# Patient Record
Sex: Female | Born: 1960
Health system: Southern US, Community
[De-identification: ages and names within clinical notes are randomized; demographics above are authoritative.]

## PROBLEM LIST (undated history)

## (undated) DIAGNOSIS — L900 Lichen sclerosus et atrophicus: Secondary | ICD-10-CM

## (undated) DIAGNOSIS — E559 Vitamin D deficiency, unspecified: Secondary | ICD-10-CM

## (undated) DIAGNOSIS — K222 Esophageal obstruction: Secondary | ICD-10-CM

## (undated) DIAGNOSIS — K219 Gastro-esophageal reflux disease without esophagitis: Secondary | ICD-10-CM

## (undated) DIAGNOSIS — E119 Type 2 diabetes mellitus without complications: Secondary | ICD-10-CM

## (undated) DIAGNOSIS — F419 Anxiety disorder, unspecified: Secondary | ICD-10-CM

## (undated) DIAGNOSIS — I10 Essential (primary) hypertension: Secondary | ICD-10-CM

## (undated) DIAGNOSIS — Z86010 Personal history of colonic polyps: Secondary | ICD-10-CM

## (undated) DIAGNOSIS — Z860101 Personal history of adenomatous and serrated colon polyps: Secondary | ICD-10-CM

## (undated) DIAGNOSIS — K5730 Diverticulosis of large intestine without perforation or abscess without bleeding: Secondary | ICD-10-CM

## (undated) DIAGNOSIS — K449 Diaphragmatic hernia without obstruction or gangrene: Secondary | ICD-10-CM

## (undated) DIAGNOSIS — E785 Hyperlipidemia, unspecified: Secondary | ICD-10-CM

## (undated) HISTORY — DX: Esophageal obstruction: K22.2

## (undated) HISTORY — DX: Personal history of adenomatous and serrated colon polyps: Z86.0101

## (undated) HISTORY — PX: COLONOSCOPY: SHX174

## (undated) HISTORY — PX: UPPER GI ENDOSCOPY: SHX6162

## (undated) HISTORY — DX: Type 2 diabetes mellitus without complications: E11.9

## (undated) HISTORY — DX: Anxiety disorder, unspecified: F41.9

## (undated) HISTORY — DX: Essential (primary) hypertension: I10

## (undated) HISTORY — DX: Personal history of colonic polyps: Z86.010

## (undated) HISTORY — PX: UPPER GASTROINTESTINAL ENDOSCOPY: SHX188

## (undated) HISTORY — DX: Hyperlipidemia, unspecified: E78.5

## (undated) HISTORY — DX: Lichen sclerosus et atrophicus: L90.0

## (undated) HISTORY — PX: POLYPECTOMY: SHX149

## (undated) HISTORY — DX: Gastro-esophageal reflux disease without esophagitis: K21.9

## (undated) HISTORY — DX: Vitamin D deficiency, unspecified: E55.9

## (undated) HISTORY — DX: Diaphragmatic hernia without obstruction or gangrene: K44.9

## (undated) HISTORY — DX: Diverticulosis of large intestine without perforation or abscess without bleeding: K57.30

---

## 2000-04-03 ENCOUNTER — Encounter: Payer: Self-pay | Admitting: Obstetrics and Gynecology

## 2000-04-03 ENCOUNTER — Encounter: Admission: RE | Admit: 2000-04-03 | Discharge: 2000-04-03 | Payer: Self-pay | Admitting: Obstetrics and Gynecology

## 2000-04-05 ENCOUNTER — Encounter: Payer: Self-pay | Admitting: Obstetrics and Gynecology

## 2000-04-05 ENCOUNTER — Encounter: Admission: RE | Admit: 2000-04-05 | Discharge: 2000-04-05 | Payer: Self-pay | Admitting: Obstetrics and Gynecology

## 2001-08-24 ENCOUNTER — Encounter: Payer: Self-pay | Admitting: Obstetrics and Gynecology

## 2001-08-24 ENCOUNTER — Encounter: Admission: RE | Admit: 2001-08-24 | Discharge: 2001-08-24 | Payer: Self-pay | Admitting: Obstetrics and Gynecology

## 2003-02-09 ENCOUNTER — Encounter: Payer: Self-pay | Admitting: Emergency Medicine

## 2003-02-09 ENCOUNTER — Emergency Department (HOSPITAL_COMMUNITY): Admission: AD | Admit: 2003-02-09 | Discharge: 2003-02-09 | Payer: Self-pay | Admitting: Emergency Medicine

## 2003-02-19 ENCOUNTER — Encounter: Payer: Self-pay | Admitting: Obstetrics and Gynecology

## 2003-02-19 ENCOUNTER — Encounter: Admission: RE | Admit: 2003-02-19 | Discharge: 2003-02-19 | Payer: Self-pay | Admitting: Obstetrics and Gynecology

## 2004-03-02 ENCOUNTER — Encounter: Admission: RE | Admit: 2004-03-02 | Discharge: 2004-03-02 | Payer: Self-pay | Admitting: Obstetrics and Gynecology

## 2004-07-26 ENCOUNTER — Ambulatory Visit: Payer: Self-pay | Admitting: Gastroenterology

## 2004-08-10 ENCOUNTER — Encounter (INDEPENDENT_AMBULATORY_CARE_PROVIDER_SITE_OTHER): Payer: Self-pay | Admitting: *Deleted

## 2004-08-10 ENCOUNTER — Ambulatory Visit: Payer: Self-pay | Admitting: Gastroenterology

## 2004-08-10 DIAGNOSIS — D126 Benign neoplasm of colon, unspecified: Secondary | ICD-10-CM | POA: Insufficient documentation

## 2004-08-10 DIAGNOSIS — K5730 Diverticulosis of large intestine without perforation or abscess without bleeding: Secondary | ICD-10-CM | POA: Insufficient documentation

## 2005-01-13 ENCOUNTER — Other Ambulatory Visit: Admission: RE | Admit: 2005-01-13 | Discharge: 2005-01-13 | Payer: Self-pay | Admitting: Family Medicine

## 2005-03-04 ENCOUNTER — Encounter: Admission: RE | Admit: 2005-03-04 | Discharge: 2005-03-04 | Payer: Self-pay | Admitting: Obstetrics and Gynecology

## 2006-03-06 ENCOUNTER — Encounter: Admission: RE | Admit: 2006-03-06 | Discharge: 2006-03-06 | Payer: Self-pay | Admitting: Obstetrics and Gynecology

## 2007-03-19 ENCOUNTER — Encounter: Admission: RE | Admit: 2007-03-19 | Discharge: 2007-03-19 | Payer: Self-pay | Admitting: Obstetrics and Gynecology

## 2007-11-25 ENCOUNTER — Emergency Department (HOSPITAL_COMMUNITY): Admission: EM | Admit: 2007-11-25 | Discharge: 2007-11-25 | Payer: Self-pay | Admitting: Emergency Medicine

## 2008-01-08 ENCOUNTER — Ambulatory Visit: Payer: Self-pay | Admitting: Gastroenterology

## 2008-01-08 DIAGNOSIS — K219 Gastro-esophageal reflux disease without esophagitis: Secondary | ICD-10-CM | POA: Insufficient documentation

## 2008-01-08 DIAGNOSIS — R1310 Dysphagia, unspecified: Secondary | ICD-10-CM | POA: Insufficient documentation

## 2008-01-16 ENCOUNTER — Ambulatory Visit: Payer: Self-pay | Admitting: Gastroenterology

## 2008-06-02 ENCOUNTER — Encounter: Admission: RE | Admit: 2008-06-02 | Discharge: 2008-06-02 | Payer: Self-pay | Admitting: Family Medicine

## 2008-06-02 ENCOUNTER — Encounter: Payer: Self-pay | Admitting: Family Medicine

## 2008-06-02 ENCOUNTER — Ambulatory Visit (HOSPITAL_COMMUNITY): Admission: RE | Admit: 2008-06-02 | Discharge: 2008-06-02 | Payer: Self-pay | Admitting: Family Medicine

## 2008-06-02 ENCOUNTER — Ambulatory Visit: Payer: Self-pay | Admitting: Cardiology

## 2008-07-22 ENCOUNTER — Encounter (INDEPENDENT_AMBULATORY_CARE_PROVIDER_SITE_OTHER): Payer: Self-pay | Admitting: *Deleted

## 2009-07-21 ENCOUNTER — Encounter (INDEPENDENT_AMBULATORY_CARE_PROVIDER_SITE_OTHER): Payer: Self-pay | Admitting: *Deleted

## 2009-07-27 ENCOUNTER — Encounter: Admission: RE | Admit: 2009-07-27 | Discharge: 2009-07-27 | Payer: Self-pay | Admitting: Obstetrics and Gynecology

## 2009-09-11 DIAGNOSIS — K222 Esophageal obstruction: Secondary | ICD-10-CM | POA: Insufficient documentation

## 2009-09-11 DIAGNOSIS — K449 Diaphragmatic hernia without obstruction or gangrene: Secondary | ICD-10-CM | POA: Insufficient documentation

## 2009-09-11 DIAGNOSIS — Z860100 Personal history of colon polyps, unspecified: Secondary | ICD-10-CM | POA: Insufficient documentation

## 2009-09-11 DIAGNOSIS — I10 Essential (primary) hypertension: Secondary | ICD-10-CM | POA: Insufficient documentation

## 2009-09-14 ENCOUNTER — Ambulatory Visit: Payer: Self-pay | Admitting: Gastroenterology

## 2009-09-29 ENCOUNTER — Ambulatory Visit: Payer: Self-pay | Admitting: Gastroenterology

## 2009-09-29 LAB — HM COLONOSCOPY

## 2009-10-01 ENCOUNTER — Encounter: Payer: Self-pay | Admitting: Gastroenterology

## 2010-03-11 ENCOUNTER — Emergency Department (HOSPITAL_COMMUNITY): Admission: EM | Admit: 2010-03-11 | Discharge: 2010-03-11 | Payer: Self-pay | Admitting: Emergency Medicine

## 2010-06-28 ENCOUNTER — Other Ambulatory Visit: Payer: Self-pay | Admitting: Obstetrics and Gynecology

## 2010-06-28 DIAGNOSIS — Z139 Encounter for screening, unspecified: Secondary | ICD-10-CM

## 2010-06-29 NOTE — Assessment & Plan Note (Signed)
Summary: discuss colon/abdominal pain--ch.   History of Present Illness Visit Type: new patient Primary GI MD: Melvia Heaps MD [facility] Primary Provider: Kyra Manges, MD Chief Complaint: epigastric pain-hx of Hiatal Hernia, pt is also due for recall colonoscopy History of Present Illness:   [name] is a 50 year old white female with a history of adenomatous colon polyps referred for followup colonoscopy.  At last colonoscopy in 2006 a 4 mm adenomatous polyp was removed.  That same year an esophageal stricture was dilated.  She has had no dysphagia since that time.  She currently has no lower GI complaints.  Approximately a month ago she had a two-hour episode of severe left upper quadrant pain that subsided spontaneously.  She took Zantac for a short period of time.  Pyrosis or dysphagia.   GI Review of Systems    Reports abdominal pain, acid reflux, bloating, dysphagia with solids, and  heartburn.     Location of  Abdominal pain: epigastric area.    Denies belching, chest pain, dysphagia with liquids, loss of appetite, nausea, vomiting, vomiting blood, weight loss, and  weight gain.        Denies anal fissure, black tarry stools, change in bowel habit, constipation, diarrhea, diverticulosis, fecal incontinence, heme positive stool, hemorrhoids, irritable bowel syndrome, jaundice, light color stool, liver problems, rectal bleeding, and  rectal pain.    Current Medications (verified): 1)  Uniretic 15-12.5 Mg Tabs (Moexipril-Hydrochlorothiazide) .... Take 1/2 Tablet By Mouth Once Daily  Allergies (verified): No Known Drug Allergies  Past History:  Past Medical History: Reviewed history from 09/11/2009 and no changes required. Current Problems:  ADENOCARCINOMA, COLON, FAMILY HX (ICD-V16.0) HYPERTENSION (ICD-401.9) COLONIC POLYPS, HX OF (ICD-V12.72) DIVERTICULOSIS, COLON (ICD-562.10) SCHATZKI'S RING (ICD-530.3) HIATAL HERNIA (ICD-553.3)  Past Surgical History: C-Section  Family  History: Family History of Colon Cancer:PGM dx'd @ 51yo Family History of Pancreatic Cancer: PGF Family History of Appendix Cancer: Father Family History of Diabetes: Paternal side of family Family History of Irritable Bowel Syndrome: Mother  Social History: Married, 2 boys, 1 girl Occupation: Product/process development scientist Patient has never smoked.  Alcohol Use - no Illicit Drug Use - no  Review of Systems       The patient complains of night sweats.  The patient denies allergy/sinus, anemia, anxiety-new, arthritis/joint pain, back pain, blood in urine, breast changes/lumps, confusion, cough, coughing up blood, depression-new, fainting, fatigue, fever, headaches-new, hearing problems, heart murmur, heart rhythm changes, itching, menstrual pain, muscle pains/cramps, nosebleeds, pregnancy symptoms, shortness of breath, skin rash, sleeping problems, sore throat, swelling of feet/legs, swollen lymph glands, thirst - excessive, urination - excessive, urination changes/pain, urine leakage, vision changes, and voice change.         All other systems were reviewed and were negative   Vital Signs:  Patient profile:   50 year old female Height:      68 inches Weight:      181 pounds BMI:     27.62 Pulse rate:   64 / minute Pulse rhythm:   regular BP sitting:   130 / 86  (left arm) Cuff size:   regular  Vitals Entered By: Francee Piccolo CMA Duncan Dull) (September 14, 2009 9:09 AM)  Physical Exam  Additional Exam:  On physical exam she is a healthy-appearing female  skin: anicteric HEENT: normocephalic; PEERLA; no nasal or pharyngeal abnormalities neck: supple nodes: no cervical lymphadenopathy chest: clear to ausculatation and percussion heart: no murmurs, gallops, or rubs abd: soft, nontender; BS normoactive; no abdominal masses, tenderness, organomegaly rectal:  deferred ext: no cynanosis, clubbing, edema skeletal: no deformities neuro: oriented x 3; no focal abnormalities    Impression &  Recommendations:  Problem # 1:  COLONIC POLYPS, HX OF (ICD-V12.72)  Patient will be scheduled for followup colonoscopy  Risks, alternatives, and complications of the procedure, including bleeding, perforation, and possible need for surgery, were explained to the patient.  Patient's questions were answered.  Orders: Colon/Endo (Colon/Endo)  Problem # 2:  SCHATZKI'S RING (ICD-530.3)  Repeat dilatation p.r.n.  Orders: Colon/Endo (Colon/Endo)  Problem # 3:  ADENOCARCINOMA, COLON, FAMILY HX (ICD-V16.0)  Grandparent with colon cancer at age 51.  Father with CA the appendix.  Orders: Colon/Endo (Colon/Endo)  Patient Instructions: 1)  Your colonoscopy/EGD is scheduled for 09/29/2009 at 2pm 2)  You can pick up your MoviPrep from your pharmacy today 3)  The medication list was reviewed and reconciled.  All changed / newly prescribed medications were explained.  A complete medication list was provided to the patient / caregiver. Prescriptions: MOVIPREP 100 GM  SOLR (PEG-KCL-NACL-NASULF-NA ASC-C) As per prep instructions.  #1 x 0   Entered by:   Merri Ray CMA (AAMA)   Authorized by:   Louis Meckel MD   Signed by:   Merri Ray CMA (AAMA) on 09/14/2009   Method used:   Faxed to ...       [facility]retail[facility]       [address]       [place], [place]  [address]       Ph: [phone] or [phone]       Fax: [phone]   RxID:   [id number]   Appended Document: discuss colon/abdominal pain--ch. Pt states she has had persistent LUQ pain and wishes to have an EGD along with her colonoscopy.  I agreed to do this.

## 2010-06-29 NOTE — Procedures (Signed)
Summary: [facility] EGD   EGD  Procedure date:  08/10/2004  Findings:      Location: [facility]   Patient Name: Brandy Smith, Brandy Smith MRN:  Procedure Procedures: Panendoscopy (EGD) CPT: 43235.  Personnel: Endoscopist: Ulyess Mort, MD.  Referred By: Montey Hora, MD.  Exam Location: Exam performed in Outpatient Clinic. Outpatient  Patient Consent: Procedure, Alternatives, Risks and Benefits discussed, consent obtained, from patient. Consent was obtained by the RN.  Indications Symptoms: Dysphagia. Dyspepsia, Reflux symptoms  Exam Exam Info: Maximum depth of insertion Duodenum, intended Duodenum. Patient position: on left side. Vocal cords visualized. Gastric retroflexion performed. Images were not taken. ASA Classification: II. Tolerance: good.  Sedation Meds: Patient assessed and found to be appropriate for moderate (conscious) sedation. Fentanyl 50 mcg. given IV. Versed 5 mg. given IV. Cetacaine Spray 2 sprays given aerosolized.  Monitoring: BP and pulse monitoring done. Oximetry used. Supplemental O2 given  Findings - HIATAL HERNIA: 2 cms. in length. schatzki ring. ICD9: Hernia, Hiatal: 553.3. - Dilation: Fundus. Maloney dilator used, Diameter: 52 mm, Minimal Resistance, No Heme present on extraction. Patient tolerance excellent.  - Normal: Fundus to Jejunum.   Assessment Abnormal examination, see findings above.  Diagnoses: 553.3: Hernia, Hiatal.   Events  Unplanned Intervention: No unplanned interventions were required.  Unplanned Events: There were no complications. Plans Medication(s): Continue current medications.  Patient Education: Patient given standard instructions for: Hiatal Hernia.  Disposition: After procedure patient sent to recovery. After recovery patient sent home.   cc: Montey Hora, MD  This report was created from the original endoscopy report, which was reviewed and signed by the above listed endoscopist.

## 2010-06-29 NOTE — Procedures (Signed)
Summary: [facility] EGD   EGD  Procedure date:  01/16/2008  Findings:      Location: [facility]   Patient Name: Brandy Smith, Brandy Smith MRN:  Procedure Procedures: Panendoscopy (EGD) CPT: 43235.    with esophageal dilation. CPT: G9296129.  Personnel: Endoscopist: Barbette Hair. Arlyce Dice, MD.  Indications Symptoms: Dysphagia.  History  Current Medications: Patient is not currently taking Coumadin.  Pre-Exam Physical: Performed Jan 16, 2008  Cardio-pulmonary exam, Abdominal exam WNL.  Exam Exam Info: Maximum depth of insertion Duodenum, intended Duodenum. Patient position: on left side. Vocal cords visualized. Gastric retroflexion performed. ASA Classification: II. Tolerance: fair, adequate exam.  Sedation Meds: Patient assessed and found to be appropriate for moderate (conscious) sedation. Fentanyl 100 mcg. given IV. Versed 10 mg. given IV. Robinul 0.2 given IV. Cetacaine Spray 2 sprays given aerosolized.  Monitoring: BP and pulse monitoring done. Oximetry used. Supplemental O2 given at 2 Liters.  Findings - Dilation: Proximal Esophagus. for dysphagia without stricture. Maloney dilator used, Diameter: 18 mm, Minimal Resistance, No Heme present on extraction. 1  total dilators used. Outcome: successful.  - Normal: Mid Esophagus to Jejunum.    Comments: Exam was difficult because of difficulties with processor providing suboptimal lighting but complete examination was accomplished. Assessment Normal examination.  Events  Unplanned Intervention: No unplanned interventions were required.  Unplanned Events: There were no complications. Plans Patient Education: Patient given standard instructions for: Stenosis / Stricture.  Scheduling: Follow-up prn.   cc: Paulita Cradle, NP  This report was created from the original endoscopy report, which was reviewed and signed by the above listed endoscopist.

## 2010-06-29 NOTE — Letter (Signed)
Summary: Results Letter  [facility]  [address] [place], [place] [address]   Phone: [phone]  Fax: [phone]        Oct 01, 2009 MRN: [id number]    [facility] [address] [place], [place]  [address]    Dear Ms. Sitzmann,  Your colonoscopy biopsies did not show any remarkable findings.  I recommend followup colonoscopy in 10 years.  Should you have any further questions or immediate concers, feel free to contact me.  Sincerely,  Barbette Hair. Arlyce Dice, M.D., [facility]          Sincerely,  Louis Meckel MD  This letter has been electronically signed by your physician.  Appended Document: Results Letter letter mailed 5.10.11

## 2010-06-29 NOTE — Procedures (Signed)
Summary: Upper Endoscopy  Patient: Brandy Smith Note: All result statuses are Final unless otherwise noted.  Tests: (1) Upper Endoscopy (EGD)   EGD Upper Endoscopy       DONE     [facility]     [address]     [place], [place]  [address]           ENDOSCOPY PROCEDURE REPORT           PATIENT:  Brandy Smith, Brandy Smith  MR#:  [id number]     BIRTHDATE:  May 06, 1961, 48 yrs. old  GENDER:  female           ENDOSCOPIST:  Barbette Hair. Arlyce Dice, MD     Referred by:           PROCEDURE DATE:  09/29/2009     PROCEDURE:  EGD, diagnostic     ASA CLASS:  Class II     INDICATIONS:  abdominal pain           MEDICATIONS:   There was residual sedation effect present from     prior procedure., Fentanyl 25 mcg IV, Versed 2 mg IV,     glycopyrrolate (Robinal) 0.2 mg IV, 0.6cc simethancone 0.6 cc PO     TOPICAL ANESTHETIC:  Exactacain Spray           DESCRIPTION OF PROCEDURE:   After the risks benefits and     alternatives of the procedure were thoroughly explained, informed     consent was obtained.  The LB GIF-H180 [id number] endoscope was     introduced through the mouth and advanced to the third portion of     the duodenum, without limitations.  The instrument was slowly     withdrawn as the mucosa was fully examined.     <<PROCEDUREIMAGES>>           The upper, middle, and distal third of the esophagus were     carefully inspected and no abnormalities were noted. The z-line     was well seen at the GEJ. The endoscope was pushed into the fundus     which was normal including a retroflexed view. The antrum,gastric     body, first and second part of the duodenum were unremarkable (see     image1, image2, image3, image4, image5, image6, image7, and     image8).    Retroflexed views revealed no abnormalities.    The     scope was then withdrawn from the patient and the procedure     completed.           COMPLICATIONS:  None           ENDOSCOPIC IMPRESSION:     1) Normal EGD  RECOMMENDATIONS:     1) hyomax as needed for abdominal pain     2) followup as needed for abdominal pain           REPEAT EXAM:  No           ______________________________     Barbette Hair. Arlyce Dice, MD           CC:  Kyra Manges, MD           n.     Rosalie DoctorBarbette Hair. Kaplan at 09/29/2009 02:44 PM           Julious Oka, [phone]  Note: An exclamation mark (!) indicates a result that was not dispersed into the flowsheet. Document Creation Date: 09/29/2009 2:44 PM  _______________________________________________________________________  (1) Order result status: Final Collection or observation date-time: 09/29/2009 14:32 Requested date-time:  Receipt date-time:  Reported date-time:  Referring Physician:   Ordering Physician: Melvia Heaps [phone]) Specimen Source:  Source: Launa Grill Order Number: [id number] Lab site:   Appended Document: Upper Endoscopy

## 2010-06-29 NOTE — Procedures (Signed)
Summary: [facility] COLON    Colonoscopy  Procedure date:  08/10/2004  Findings:      Location:  [facility].   Patient Name: Brandy Smith, Brandy Smith MRN:  Procedure Procedures: Colonoscopy CPT: 56433.  Personnel: Endoscopist: Ulyess Mort, MD.  Referred By: Montey Hora, MD.  Exam Location: Exam performed in Outpatient Clinic. Outpatient  Patient Consent: Procedure, Alternatives, Risks and Benefits discussed, consent obtained, from patient. Consent was obtained by the RN.  Indications Symptoms: Abdominal pain / bloating.  Increased Risk Screening: For family history of colorectal neoplasia, in  parent  Average Risk Screening Routine.  Exam Exam: Extent of exam reached: Cecum, extent intended: Cecum.  The cecum was identified by appendiceal orifice and IC valve. Colon retroflexion performed. Images were not taken. ASA Classification: II. Tolerance: good.  Monitoring: Pulse and BP monitoring, Oximetry used. Supplemental O2 given.  Colon Prep Prep results: good.  Sedation Meds: Patient assessed and found to be appropriate for moderate (conscious) sedation. Fentanyl 100 mcg. given IV. Versed 15 given IV.  Findings - DIVERTICULOSIS: Transverse Colon to Sigmoid Colon. ICD9: Diverticulosis: 562.10. Comments: mild.  POLYP: Cecum, Maximum size: 4 mm. sessile polyp. Procedure:  snare with cautery, removed, retrieved, Polyp sent to pathology. ICD9: Colon Polyps: 211.3.   Assessment Abnormal examination, see findings above.  Diagnoses: 211.3: Colon Polyps.  562.10: Diverticulosis.   Events  Unplanned Interventions: No intervention was required.  Unplanned Events: There were no complications. Plans Medication Plan: Await pathology. Continue current medications.  Patient Education: Patient given standard instructions for: Polyps. Diverticulosis. Yearly hemoccult testing recommended. Patient instructed to get routine colonoscopy every 4 years.    Disposition: After procedure patient sent to recovery. After recovery patient sent home.   cc: Montey Hora, MD  This report was created from the original endoscopy report, which was reviewed and signed by the above listed endoscopist.

## 2010-06-29 NOTE — Letter (Signed)
Summary: New Patient letter  [facility]  [address] [place], [place] [address]   Phone: [phone]  Fax: [phone]       07/21/2009 MRN: [id number]  [facility] [address] [place], [place]  [address]  Dear Ms. Daphine Deutscher,  Welcome to the Gastroenterology Division at [facility].    You are scheduled to see Dr. Arlyce Dice on 08-19-09 at 8:30a.m. on the 3rd floor at [facility], [address]  We ask that you try to arrive at our office 15 minutes prior to your appointment time to allow for check-in.  We would like you to complete the enclosed self-administered evaluation form prior to your visit and bring it with you on the day of your appointment.  We will review it with you.  Also, please bring a complete list of all your medications or, if you prefer, bring the medication bottles and we will list them.  Please bring your insurance card so that we may make a copy of it.  If your insurance requires a referral to see a specialist, please bring your referral form from your primary care physician.  Co-payments are due at the time of your visit and may be paid by cash, check or credit card.     Your office visit will consist of a consult with your physician (includes a physical exam), any laboratory testing he/she may order, scheduling of any necessary diagnostic testing (e.g. x-ray, ultrasound, CT-scan), and scheduling of a procedure (e.g. Endoscopy, Colonoscopy) if required.  Please allow enough time on your schedule to allow for any/all of these possibilities.    If you cannot keep your appointment, please call [phone] to cancel or reschedule prior to your appointment date.  This allows [place] the opportunity to schedule an appointment for another patient in need of care.  If you do not cancel or reschedule by 5 p.m. the business day prior to your appointment date, you will be charged a $50.00 late cancellation/no-show fee.    Thank you for choosing [facility] for your medical needs.  We appreciate the opportunity to care for you.  Please visit [place] at our website  to learn more about our practice.                     Sincerely,                                                             The Gastroenterology Division

## 2010-06-29 NOTE — Miscellaneous (Signed)
  Clinical Lists Changes  Medications: Added new medication of HYOMAX-SL 0.125 MG SUBL (HYOSCYAMINE SULFATE) take 2 tabs s.l. q4h as needed abdominal pain - Signed Rx of HYOMAX-SL 0.125 MG SUBL (HYOSCYAMINE SULFATE) take 2 tabs s.l. q4h as needed abdominal pain;  #15 x 1;  Signed;  Entered by: Louis Meckel MD;  Authorized by: Louis Meckel MD;  Method used: Print then Give to Patient    Prescriptions: HYOMAX-SL 0.125 MG SUBL (HYOSCYAMINE SULFATE) take 2 tabs s.l. q4h as needed abdominal pain  #15 x 1   Entered and Authorized by:   Louis Meckel MD   Signed by:   Louis Meckel MD on 09/29/2009   Method used:   Print then Give to Patient   RxID:   [id number]

## 2010-06-29 NOTE — Procedures (Signed)
Summary: Colonoscopy  Patient: [name] Note: All result statuses are Final unless otherwise noted.  Tests: (1) Colonoscopy (COL)   COL Colonoscopy           DONE     [facility]     [address]     [place], [place]  [address]           COLONOSCOPY PROCEDURE REPORT           PATIENT:  Brandy Smith, Brandy Smith  MR#:  [id number]     BIRTHDATE:  Jul 19, 1960, 48 yrs. old  GENDER:  female           ENDOSCOPIST:  Barbette Hair. Arlyce Dice, MD     Referred by:           PROCEDURE DATE:  09/29/2009     PROCEDURE:  Colonoscopy with snare polypectomy     ASA CLASS:  Class II     INDICATIONS:  1) history of pre-cancerous (adenomatous) colon     polyps  2) screening           MEDICATIONS:   Fentanyl 100 mcg IV, Versed 9 mg IV           DESCRIPTION OF PROCEDURE:   After the risks benefits and     alternatives of the procedure were thoroughly explained, informed     consent was obtained.  Digital rectal exam was performed and     revealed no abnormalities.   The LB [id number] endoscope     was introduced through the anus and advanced to the cecum, which     was identified by both the appendix and ileocecal valve, without     limitations.  The quality of the prep was excellent, using     MoviPrep.  The instrument was then slowly withdrawn as the colon     was fully examined.     <<PROCEDUREIMAGES>>           FINDINGS:  A diminutive polyp was found in the sigmoid colon. It     was 2 mm in size. It was found 20 cm from the point of entry.     Polyp was snared without cautery. Retrieval was successful (see     image5). snare polyp  This was otherwise a normal examination of     the colon (see image1, image2, image3, image4, image6, and     image7).   Retroflexed views in the rectum revealed no     abnormalities.    The time to cecum =  2.75  minutes. The scope     was then withdrawn (time =  8.5  min) from the patient and the     procedure completed.           COMPLICATIONS:  None        ENDOSCOPIC IMPRESSION:     1) 2 mm diminutive polyp in the sigmoid colon     2) Otherwise normal examination     RECOMMENDATIONS:     1) If the polyp(s) removed today are proven to be adenomatous     (pre-cancerous) polyps, you will need a repeat colonoscopy in 5     years. Otherwise you should continue to follow colorectal cancer     screening guidelines for "routine risk" patients with colonoscopy     in 10 years.           REPEAT EXAM:   You will receive a letter from Dr. Arlyce Dice in  1-2     weeks, after reviewing the final pathology, with followup     recommendations.           ______________________________     Barbette Hair Arlyce Dice, MD           CC: Kyra Manges, MD           n.     Rosalie DoctorBarbette Hair. Gerrad Welker at 09/29/2009 02:41 PM           Julious Oka, [phone]  Note: An exclamation mark (!) indicates a result that was not dispersed into the flowsheet. Document Creation Date: 09/29/2009 2:41 PM _______________________________________________________________________  (1) Order result status: Final Collection or observation date-time: 09/29/2009 14:27 Requested date-time:  Receipt date-time:  Reported date-time:  Referring Physician:   Ordering Physician: Melvia Heaps [phone]) Specimen Source:  Source: Launa Grill Order Number: [id number] Lab site:   Appended Document: Colonoscopy     Procedures Next Due Date:    Colonoscopy: 09/2019

## 2010-06-29 NOTE — Letter (Signed)
Summary: [facility] Instructions  [facility]  [address] [place], [place] [address]   Phone: [phone]  Fax: [phone]       Brandy Smith    26-Sep-1960    MRN: [id number]        Procedure Day /Date:TUESDAY 09/29/2009     Arrival Time:1PM     Procedure Time:2PM     Location of Procedure:                    X   [facility] (4th Floor)   PREPARATION FOR COLONOSCOPY WITH MOVIPREP/EGD   Starting 5 days prior to your procedure 09/23/2009 do not eat nuts, seeds, popcorn, corn, beans, peas,  salads, or any raw vegetables.  Do not take any fiber supplements (e.g. Metamucil, Citrucel, and Benefiber).  THE DAY BEFORE YOUR PROCEDURE         DATE: 09/28/2009  DAY: MONDAY  1.  Drink clear liquids the entire day-NO SOLID FOOD  2.  Do not drink anything colored red or purple.  Avoid juices with pulp.  No orange juice.  3.  Drink at least 64 oz. (8 glasses) of fluid/clear liquids during the day to prevent dehydration and help the prep work efficiently.  CLEAR LIQUIDS INCLUDE: Water Jello Ice Popsicles Tea (sugar ok, no milk/cream) Powdered fruit flavored drinks Coffee (sugar ok, no milk/cream) Gatorade Juice: apple, white grape, white cranberry  Lemonade Clear bullion, consomm, broth Carbonated beverages (any kind) Strained chicken noodle soup Hard Candy                             4.  In the morning, mix first dose of MoviPrep solution:    Empty 1 Pouch A and 1 Pouch B into the disposable container    Add lukewarm drinking water to the top line of the container. Mix to dissolve    Refrigerate (mixed solution should be used within 24 hrs)  5.  Begin drinking the prep at 5:00 p.m. The MoviPrep container is divided by 4 marks.   Every 15 minutes drink the solution down to the next mark (approximately 8 oz) until the full liter is complete.   6.  Follow completed prep with 16 oz of clear liquid of your choice (Nothing red or purple).  Continue to  drink clear liquids until bedtime.  7.  Before going to bed, mix second dose of MoviPrep solution:    Empty 1 Pouch A and 1 Pouch B into the disposable container    Add lukewarm drinking water to the top line of the container. Mix to dissolve    Refrigerate  THE DAY OF YOUR PROCEDURE      DATE: 09/29/2009 DAY: TUESDAY  Beginning at 9 a.m. (5 hours before procedure):         1. Every 15 minutes, drink the solution down to the next mark (approx 8 oz) until the full liter is complete.  2. Follow completed prep with 16 oz. of clear liquid of your choice.    3. You may drink clear liquids until 12PM (2 HOURS BEFORE PROCEDURE).   MEDICATION INSTRUCTIONS  Unless otherwise instructed, you should take regular prescription medications with a small sip of water   as early as possible the morning of your procedure.          OTHER INSTRUCTIONS  You will need a responsible adult at least 51 years of age to  accompany you and drive you home.   This person must remain in the waiting room during your procedure.  Wear loose fitting clothing that is easily removed.  Leave jewelry and other valuables at home.  However, you may wish to bring a book to read or  an iPod/MP3 player to listen to music as you wait for your procedure to start.  Remove all body piercing jewelry and leave at home.  Total time from sign-in until discharge is approximately 2-3 hours.  You should go home directly after your procedure and rest.  You can resume normal activities the  day after your procedure.  The day of your procedure you should not:   Drive   Make legal decisions   Operate machinery   Drink alcohol   Return to work  You will receive specific instructions about eating, activities and medications before you leave.    The above instructions have been reviewed and explained to me by   _______________________    I fully understand and can verbalize these instructions  _____________________________ Date _________

## 2010-08-02 ENCOUNTER — Ambulatory Visit (HOSPITAL_COMMUNITY)
Admission: RE | Admit: 2010-08-02 | Discharge: 2010-08-02 | Disposition: A | Discharge status: Home / Self Care | Payer: BC Managed Care – PPO | Source: Ambulatory Visit | Attending: Obstetrics and Gynecology | Admitting: Obstetrics and Gynecology

## 2010-08-02 DIAGNOSIS — Z139 Encounter for screening, unspecified: Secondary | ICD-10-CM

## 2010-08-02 DIAGNOSIS — Z1231 Encounter for screening mammogram for malignant neoplasm of breast: Secondary | ICD-10-CM | POA: Insufficient documentation

## 2010-08-12 LAB — CBC
HCT: 40.1 % (ref 36.0–46.0)
MCV: 88.5 fL (ref 78.0–100.0)
Platelets: 231 10*3/uL (ref 150–400)
RBC: 4.53 MIL/uL (ref 3.87–5.11)
RDW: 13.2 % (ref 11.5–15.5)
WBC: 9.9 10*3/uL (ref 4.0–10.5)

## 2010-08-12 LAB — DIFFERENTIAL
Basophils Absolute: 0 10*3/uL (ref 0.0–0.1)
Basophils Relative: 0 % (ref 0–1)
Eosinophils Absolute: 0.2 10*3/uL (ref 0.0–0.7)
Eosinophils Relative: 2 % (ref 0–5)
Monocytes Absolute: 0.7 10*3/uL (ref 0.1–1.0)
Monocytes Relative: 7 % (ref 3–12)

## 2010-08-12 LAB — COMPREHENSIVE METABOLIC PANEL
ALT: 29 U/L (ref 0–35)
AST: 26 U/L (ref 0–37)
Albumin: 4 g/dL (ref 3.5–5.2)
Alkaline Phosphatase: 45 U/L (ref 39–117)
BUN: 8 mg/dL (ref 6–23)
Chloride: 98 mEq/L (ref 96–112)
Potassium: 3.1 mEq/L — ABNORMAL LOW (ref 3.5–5.1)
Sodium: 137 mEq/L (ref 135–145)
Total Bilirubin: 1.1 mg/dL (ref 0.3–1.2)
Total Protein: 7.1 g/dL (ref 6.0–8.3)

## 2010-09-23 ENCOUNTER — Encounter: Payer: Self-pay | Admitting: Cardiovascular Disease

## 2010-09-24 ENCOUNTER — Ambulatory Visit (INDEPENDENT_AMBULATORY_CARE_PROVIDER_SITE_OTHER): Payer: BC Managed Care – PPO | Admitting: Cardiovascular Disease

## 2010-09-24 ENCOUNTER — Encounter: Payer: Self-pay | Admitting: Cardiovascular Disease

## 2010-09-24 VITALS — BP 120/82 | HR 65 | Resp 14 | Ht 68.0 in | Wt 180.0 lb

## 2010-09-24 DIAGNOSIS — I10 Essential (primary) hypertension: Secondary | ICD-10-CM

## 2010-09-24 DIAGNOSIS — F419 Anxiety disorder, unspecified: Secondary | ICD-10-CM | POA: Insufficient documentation

## 2010-09-24 DIAGNOSIS — R079 Chest pain, unspecified: Secondary | ICD-10-CM | POA: Insufficient documentation

## 2010-09-24 NOTE — Patient Instructions (Addendum)
Your physician has requested that you have a stress echocardiogram. For further information please visit [email or web address] Please follow instruction sheet as given.  You have been referred to Dr. Rene Paci as a primary care doctor.

## 2010-09-24 NOTE — Assessment & Plan Note (Signed)
Normal exam and ECG  Atypical pain more likely related to esoph. Spasm  Stress Echo

## 2010-09-24 NOTE — Assessment & Plan Note (Signed)
Talk with primary regarding Cymbalta Discussed social and lifestyle issues and taking time for herself and setting limits on others demands

## 2010-09-24 NOTE — Assessment & Plan Note (Signed)
Well controlled.  Continue current medications and low sodium Dash type diet.    

## 2010-09-24 NOTE — Progress Notes (Signed)
50 yo referred by [facility] for HTN and SSCP.  4 episodes over the last two years.  Starts in epigastric area and doubles her over.  10/11 eval at [facility] negative for cardiac cause.  Has not had GB w/u  Saw  gin a year ago and endo and colon showed only small polyp.  Pain lasts more than an hour and subsides.  No associated nausea, vohmiting, palpitations, dyspnea or edema.  CRF HTN. Normal stress echo two years ago.  Compliant with meds  Significant anxiety/depression.  Husband Dorene Sorrow is a patient of mine.  Has autistic child.  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  Discussed starting Cymbalta or Prosac.  She would like a new primary and I recommended Azalia Bilis.  ROS: Denies fever, malais, weight loss, blurry vision, decreased visual acuity, cough, sputum, SOB, hemoptysis, pleuritic pain, palpitaitons, heartburn, abdominal pain, melena, lower extremity edema, claudication, or rash.   General: Affect appropriate Healthy:  appears stated age HEENT: normal Neck supple with no adenopathy JVP normal no bruits no thyromegaly Lungs clear with no wheezing and good diaphragmatic motion Heart:  S1/S2 no murmur,rub, gallop or click PMI normal Abdomen: benighn, BS positve, no tenderness, no AAA no bruit.  No HSM or HJR Distal pulses intact with no bruits No edema Neuro non-focal Skin warm and dry No muscular weakness  Medications Current Outpatient Prescriptions  Medication Sig Dispense Refill  . moexipril-hydrochlorothiazide (UNIRETIC) 15-25 MG per tablet Take 1 tablet by mouth daily.        Marland Kitchen omeprazole (PRILOSEC) 20 MG capsule Take 20 mg by mouth daily.        Marland Kitchen DISCONTD: hyoscyamine (LEVSIN, ANASPAZ) 0.125 MG tablet Take 0.125 mg by mouth every 4 (four) hours as needed.          Allergies Review of patient's allergies indicates no known allergies.  Family History: Family History  Problem Relation Age of Onset  . Colon cancer Paternal Grandmother 38  . Pancreatic cancer Paternal  Grandfather   . Diabetes    . Irritable bowel syndrome Mother     Social History: History   Social History  . Marital Status: Married    Spouse Name: N/A    Number of Children: 2  . Years of Education: N/A   Occupational History  . Auditor    Social History Main Topics  . Smoking status: Never Smoker   . Smokeless tobacco: Not on file  . Alcohol Use: No  . Drug Use: No  . Sexually Active: Not on file   Other Topics Concern  . Not on file   Social History Narrative  . No narrative on file    Electrocardiogram:  NSR 65 normal ECG  Assessment and Plan

## 2010-10-04 ENCOUNTER — Other Ambulatory Visit: Payer: Self-pay | Admitting: *Deleted

## 2010-10-11 ENCOUNTER — Other Ambulatory Visit (HOSPITAL_COMMUNITY): Payer: BC Managed Care – PPO | Admitting: Radiology

## 2010-11-03 ENCOUNTER — Encounter: Payer: Self-pay | Admitting: *Deleted

## 2010-11-11 ENCOUNTER — Encounter: Payer: Self-pay | Admitting: *Deleted

## 2010-11-11 ENCOUNTER — Ambulatory Visit: Payer: BC Managed Care – PPO | Admitting: Internal Medicine

## 2010-11-15 ENCOUNTER — Other Ambulatory Visit (HOSPITAL_COMMUNITY): Payer: BC Managed Care – PPO | Admitting: Radiology

## 2010-11-15 ENCOUNTER — Encounter: Payer: BC Managed Care – PPO | Admitting: Cardiovascular Disease

## 2010-11-30 ENCOUNTER — Encounter: Payer: Self-pay | Admitting: *Deleted

## 2010-12-06 ENCOUNTER — Encounter: Payer: BC Managed Care – PPO | Admitting: Physician Assistant

## 2010-12-06 ENCOUNTER — Other Ambulatory Visit (HOSPITAL_COMMUNITY): Payer: BC Managed Care – PPO | Admitting: Radiology

## 2010-12-09 ENCOUNTER — Encounter: Payer: Self-pay | Admitting: Physician Assistant

## 2010-12-16 ENCOUNTER — Ambulatory Visit (INDEPENDENT_AMBULATORY_CARE_PROVIDER_SITE_OTHER): Payer: BC Managed Care – PPO | Admitting: Physician Assistant

## 2010-12-16 ENCOUNTER — Ambulatory Visit (HOSPITAL_COMMUNITY): Payer: BC Managed Care – PPO | Attending: Cardiovascular Disease | Admitting: Radiology

## 2010-12-16 DIAGNOSIS — R079 Chest pain, unspecified: Secondary | ICD-10-CM

## 2010-12-16 DIAGNOSIS — I059 Rheumatic mitral valve disease, unspecified: Secondary | ICD-10-CM | POA: Insufficient documentation

## 2010-12-16 DIAGNOSIS — I10 Essential (primary) hypertension: Secondary | ICD-10-CM | POA: Insufficient documentation

## 2010-12-16 DIAGNOSIS — R072 Precordial pain: Secondary | ICD-10-CM

## 2010-12-16 NOTE — Progress Notes (Deleted)
Exercise Treadmill Test  Pre-Exercise Testing Evaluation Rhythm: normal sinus  Rate: 78   PR:  .16 QRS:  .08  QT:  .37 QTc: .43           Test  Exercise Tolerance Test Ordering MD: Charlton Haws, MD  Interpreting MD:  Tereso Newcomer, PA-C  Unique Test No: 1  Treadmill:  1  Indication for ETT: chest pain - rule out ischemia  Contraindication to ETT: No   Stress Modality: exercise - treadmill  Cardiac Imaging Performed: non   Protocol: standard Bruce - submaximal  Max BP:  ***/***  Max MPHR (bpm):  170 85% MPR (bpm):  144  MPHR obtained (bpm):  *** % MPHR obtained:  ***  Reached 85% MPHR (min:sec):  *** Total Exercise Time (min-sec):  ***  Workload in METS:  *** Borg Scale: ***  Reason ETT Terminated:  {CHL REASON TERMINATED FOR ETT:21021064}    ST Segment Analysis At Rest: {CHL ST SEGMENT AT REST FOR ZOX:09604540} With Exercise: {CHL ST SEGMENT WITH EXERCISE FOR JWJ:19147829}  Other Information Arrhythmia:  {CHL ARRHYTHMIA FOR FAO:13086578} Angina during ETT:  {CHL ANGINA DURING ION:62952841} Quality of ETT:  {CHL QUALITY OF LKG:40102725}  ETT Interpretation:  {CHL INTERPRETATION FOR DGU:44034742}  Comments: ***  Recommendations: ***

## 2010-12-16 NOTE — Progress Notes (Signed)
Exercise Treadmill Test  Pre-Exercise Testing Evaluation Rhythm: normal sinus  Rate: 78   PR:  .16 QRS:  .08  QT:  .37 QTc: .43           Test  Exercise Tolerance Test Ordering MD: Charlton Haws, MD  Interpreting MD:  Tereso Newcomer, PA-C  Unique Test No: 1  Treadmill:  1  Indication for ETT: chest pain - rule out ischemia  Contraindication to ETT: No   Stress Modality: exercise - treadmill  Cardiac Imaging Performed: non   Protocol: standard Bruce - submaximal  Max BP:  177/64  Max MPHR (bpm):  170 85% MPR (bpm):  144  MPHR obtained (bpm):  164 % MPHR obtained:  95  Reached 85% MPHR (min:sec):  3:22 Total Exercise Time (min-sec):  6:00  Workload in METS:  10.1 Borg Scale: 17  Reason ETT Terminated:  patient's desire to stop    ST Segment Analysis At Rest: normal ST segments - no evidence of significant ST depression With Exercise: non-specific ST changes  Other Information Arrhythmia:  No Angina during ETT:  absent (0) Quality of ETT:  diagnostic  ETT Interpretation:  normal - no evidence of ischemia by ST analysis  Comments: Fair exercise tolerance. Normal BP response to exercise. No chest pain. No ST-T changes to suggest ischemia.  Recommendations: Follow up with Dr. Eden Emms as directed.

## 2010-12-20 ENCOUNTER — Telehealth: Payer: Self-pay | Admitting: Cardiovascular Disease

## 2010-12-20 NOTE — Telephone Encounter (Signed)
PT AWARE OF ECHO RESULTS./CY 

## 2010-12-20 NOTE — Telephone Encounter (Signed)
Pt rtn call to christine-pls call [phone] ext [phone], requesting call today christine out tomorrow and pt going out of town [address]

## 2010-12-22 ENCOUNTER — Ambulatory Visit: Payer: BC Managed Care – PPO | Admitting: Internal Medicine

## 2011-01-05 ENCOUNTER — Telehealth: Payer: Self-pay | Admitting: Cardiovascular Disease

## 2011-01-05 NOTE — Telephone Encounter (Signed)
Elpidio Galea states pt having dental work in three weeks. Darl Pikes wants to know if pt needs to have pre meds before procedure.

## 2011-01-05 NOTE — Telephone Encounter (Signed)
Spoke with dr neil's office, pt with mitral regurg. No SBE required Brandy Smith

## 2011-02-02 ENCOUNTER — Other Ambulatory Visit (INDEPENDENT_AMBULATORY_CARE_PROVIDER_SITE_OTHER): Payer: BC Managed Care – PPO

## 2011-02-02 ENCOUNTER — Ambulatory Visit (INDEPENDENT_AMBULATORY_CARE_PROVIDER_SITE_OTHER): Payer: BC Managed Care – PPO | Admitting: Internal Medicine

## 2011-02-02 ENCOUNTER — Encounter: Payer: Self-pay | Admitting: Internal Medicine

## 2011-02-02 DIAGNOSIS — R079 Chest pain, unspecified: Secondary | ICD-10-CM

## 2011-02-02 DIAGNOSIS — R7309 Other abnormal glucose: Secondary | ICD-10-CM

## 2011-02-02 DIAGNOSIS — R739 Hyperglycemia, unspecified: Secondary | ICD-10-CM

## 2011-02-02 DIAGNOSIS — I10 Essential (primary) hypertension: Secondary | ICD-10-CM

## 2011-02-02 LAB — CBC WITH DIFFERENTIAL/PLATELET
Basophils Relative: 0.4 % (ref 0.0–3.0)
Eosinophils Absolute: 0.2 10*3/uL (ref 0.0–0.7)
Eosinophils Relative: 2.4 % (ref 0.0–5.0)
Hemoglobin: 13.5 g/dL (ref 12.0–15.0)
Lymphocytes Relative: 28.5 % (ref 12.0–46.0)
MCHC: 33.8 g/dL (ref 30.0–36.0)
Monocytes Relative: 7.6 % (ref 3.0–12.0)
Neutro Abs: 4.7 10*3/uL (ref 1.4–7.7)
RBC: 4.49 Mil/uL (ref 3.87–5.11)
WBC: 7.7 10*3/uL (ref 4.5–10.5)

## 2011-02-02 LAB — COMPREHENSIVE METABOLIC PANEL
BUN: 10 mg/dL (ref 6–23)
CO2: 29 mEq/L (ref 19–32)
Calcium: 9.5 mg/dL (ref 8.4–10.5)
Chloride: 102 mEq/L (ref 96–112)
Creatinine, Ser: 0.5 mg/dL (ref 0.4–1.2)
GFR: 129.2 mL/min (ref >60.00)
Glucose, Bld: 105 mg/dL — ABNORMAL HIGH (ref 70–99)

## 2011-02-02 LAB — HEMOGLOBIN A1C: Hgb A1c MFr Bld: 7.6 % — ABNORMAL HIGH (ref 4.6–6.5)

## 2011-02-02 LAB — TSH: TSH: 2.44 u[IU]/mL (ref 0.35–5.50)

## 2011-02-02 NOTE — Assessment & Plan Note (Signed)
I will check her lytes and renal function today

## 2011-02-02 NOTE — Patient Instructions (Signed)

## 2011-02-02 NOTE — Progress Notes (Signed)
  Subjective:    Patient ID: Brandy Smith, female    DOB: 05/04/1961, 50 y.o.   MRN: [id number]  Hypertension This is a chronic problem. The current episode started more than 1 year ago. The problem has been gradually improving since onset. The problem is controlled. Pertinent negatives include no anxiety, blurred vision, chest pain, headaches, malaise/fatigue, neck pain, orthopnea, palpitations, peripheral edema, PND, shortness of breath or sweats. Past treatments include ACE inhibitors and diuretics. The current treatment provides moderate improvement. Compliance problems include exercise and diet.       Review of Systems  Constitutional: Positive for unexpected weight change (weight gain). Negative for fever, chills, malaise/fatigue, diaphoresis, activity change, appetite change and fatigue.  HENT: Negative.  Negative for neck pain.   Eyes: Negative.  Negative for blurred vision.  Respiratory: Negative for apnea, cough, choking, chest tightness, shortness of breath, wheezing and stridor.   Cardiovascular: Negative for chest pain, palpitations, orthopnea, leg swelling and PND.  Gastrointestinal: Negative for nausea, vomiting, abdominal pain, diarrhea, constipation and blood in stool.  Genitourinary: Negative.   Musculoskeletal: Negative.   Skin: Negative.   Neurological: Negative.  Negative for dizziness, tremors, seizures, syncope, facial asymmetry, speech difficulty, weakness, light-headedness, numbness and headaches.  Hematological: Negative for adenopathy. Does not bruise/bleed easily.  Psychiatric/Behavioral: Negative.        Objective:   Physical Exam  Vitals reviewed. Constitutional: She is oriented to person, place, and time. She appears well-developed and well-nourished. No distress.  HENT:  Mouth/Throat: Oropharynx is clear and moist. No oropharyngeal exudate.  Eyes: Conjunctivae are normal. Right eye exhibits no discharge. Left eye exhibits no discharge. No scleral  icterus.  Neck: Normal range of motion. Neck supple. No JVD present. No tracheal deviation present. No thyromegaly present.  Cardiovascular: Normal rate, regular rhythm and intact distal pulses.  Exam reveals friction rub. Exam reveals no gallop.   No murmur heard. Pulmonary/Chest: Effort normal and breath sounds normal. No stridor. No respiratory distress. She has no wheezes. She has no rales. She exhibits no tenderness.  Abdominal: Soft. Bowel sounds are normal. She exhibits no distension and no mass. There is no tenderness. There is no rebound and no guarding.  Musculoskeletal: Normal range of motion. She exhibits no edema and no tenderness.  Lymphadenopathy:    She has no cervical adenopathy.  Neurological: She is oriented to person, place, and time. She displays normal reflexes. She exhibits normal muscle tone.  Skin: Skin is warm and dry. No rash noted. She is not diaphoretic. No erythema. No pallor.  Psychiatric: She has a normal mood and affect. Her behavior is normal. Judgment and thought content normal.      Lab Results  Component Value Date   WBC 9.9 03/11/2010   HGB 13.6 03/11/2010   HCT 40.1 03/11/2010   PLT 231 03/11/2010   ALT 29 03/11/2010   AST 26 03/11/2010   NA 137 03/11/2010   K 3.1* 03/11/2010   CL 98 03/11/2010   CREATININE 0.73 03/11/2010   BUN 8 03/11/2010   CO2 30 03/11/2010      Assessment & Plan:

## 2011-02-02 NOTE — Assessment & Plan Note (Signed)
Evaluated by cardiology and no evidence of ischemia

## 2011-02-03 ENCOUNTER — Encounter: Payer: Self-pay | Admitting: Internal Medicine

## 2011-05-31 HISTORY — PX: BREAST BIOPSY: SHX20

## 2011-08-23 ENCOUNTER — Telehealth: Payer: Self-pay

## 2011-08-23 ENCOUNTER — Other Ambulatory Visit: Payer: Self-pay | Admitting: Obstetrics & Gynecology

## 2011-08-23 DIAGNOSIS — Z1231 Encounter for screening mammogram for malignant neoplasm of breast: Secondary | ICD-10-CM

## 2011-08-23 DIAGNOSIS — Z780 Asymptomatic menopausal state: Secondary | ICD-10-CM

## 2011-08-23 NOTE — Telephone Encounter (Signed)
correct 

## 2011-08-23 NOTE — Telephone Encounter (Signed)
Im aware of no labs prior to appt but just wanted to run this by MD first correct?

## 2011-08-23 NOTE — Telephone Encounter (Signed)
Message copied by Sandi Mealy on Tue Aug 23, 2011  2:17 PM ------      Message from: Newell Coral      Created: Tue Aug 23, 2011  2:04 PM      Regarding: cpe sch needs labs       The pt scheduled a cpe and is hoping to have labs done thanks!

## 2011-09-01 ENCOUNTER — Ambulatory Visit
Admission: RE | Admit: 2011-09-01 | Discharge: 2011-09-01 | Disposition: A | Discharge status: Home / Self Care | Payer: BC Managed Care – PPO | Source: Ambulatory Visit | Attending: Obstetrics & Gynecology | Admitting: Obstetrics & Gynecology

## 2011-09-01 DIAGNOSIS — Z1231 Encounter for screening mammogram for malignant neoplasm of breast: Secondary | ICD-10-CM

## 2011-09-01 DIAGNOSIS — Z780 Asymptomatic menopausal state: Secondary | ICD-10-CM

## 2011-09-05 ENCOUNTER — Other Ambulatory Visit: Payer: Self-pay | Admitting: Obstetrics & Gynecology

## 2011-09-05 ENCOUNTER — Encounter: Payer: BC Managed Care – PPO | Admitting: Internal Medicine

## 2011-09-05 DIAGNOSIS — R928 Other abnormal and inconclusive findings on diagnostic imaging of breast: Secondary | ICD-10-CM

## 2011-09-06 ENCOUNTER — Ambulatory Visit
Admission: RE | Admit: 2011-09-06 | Discharge: 2011-09-06 | Disposition: A | Discharge status: Home / Self Care | Payer: BC Managed Care – PPO | Source: Ambulatory Visit | Attending: Obstetrics & Gynecology | Admitting: Obstetrics & Gynecology

## 2011-09-06 ENCOUNTER — Other Ambulatory Visit: Payer: Self-pay | Admitting: Obstetrics & Gynecology

## 2011-09-06 DIAGNOSIS — R928 Other abnormal and inconclusive findings on diagnostic imaging of breast: Secondary | ICD-10-CM

## 2011-09-12 ENCOUNTER — Encounter: Payer: BC Managed Care – PPO | Admitting: Internal Medicine

## 2011-09-14 ENCOUNTER — Other Ambulatory Visit: Payer: Self-pay | Admitting: Radiology

## 2011-09-14 ENCOUNTER — Ambulatory Visit
Admission: RE | Admit: 2011-09-14 | Discharge: 2011-09-14 | Disposition: A | Discharge status: Home / Self Care | Payer: BC Managed Care – PPO | Source: Ambulatory Visit | Attending: Obstetrics & Gynecology | Admitting: Obstetrics & Gynecology

## 2011-09-14 DIAGNOSIS — R928 Other abnormal and inconclusive findings on diagnostic imaging of breast: Secondary | ICD-10-CM

## 2012-08-30 ENCOUNTER — Other Ambulatory Visit: Payer: Self-pay

## 2012-08-30 DIAGNOSIS — Z1231 Encounter for screening mammogram for malignant neoplasm of breast: Secondary | ICD-10-CM

## 2012-09-17 ENCOUNTER — Ambulatory Visit
Admission: RE | Admit: 2012-09-17 | Discharge: 2012-09-17 | Disposition: A | Discharge status: Home / Self Care | Payer: BC Managed Care – PPO | Source: Ambulatory Visit

## 2012-09-17 DIAGNOSIS — Z1231 Encounter for screening mammogram for malignant neoplasm of breast: Secondary | ICD-10-CM

## 2013-09-18 ENCOUNTER — Other Ambulatory Visit: Payer: Self-pay

## 2013-09-18 DIAGNOSIS — Z1231 Encounter for screening mammogram for malignant neoplasm of breast: Secondary | ICD-10-CM

## 2013-09-23 ENCOUNTER — Encounter (INDEPENDENT_AMBULATORY_CARE_PROVIDER_SITE_OTHER): Payer: Self-pay

## 2013-09-23 ENCOUNTER — Ambulatory Visit
Admission: RE | Admit: 2013-09-23 | Discharge: 2013-09-23 | Disposition: A | Discharge status: Home / Self Care | Payer: BC Managed Care – PPO | Source: Ambulatory Visit

## 2013-09-23 DIAGNOSIS — Z1231 Encounter for screening mammogram for malignant neoplasm of breast: Secondary | ICD-10-CM

## 2013-10-11 ENCOUNTER — Encounter: Payer: Self-pay | Admitting: Family Medicine

## 2013-10-11 ENCOUNTER — Telehealth: Payer: Self-pay | Admitting: Nurse Practitioner

## 2013-10-11 ENCOUNTER — Ambulatory Visit (INDEPENDENT_AMBULATORY_CARE_PROVIDER_SITE_OTHER): Payer: BC Managed Care – PPO | Admitting: Family Medicine

## 2013-10-11 ENCOUNTER — Other Ambulatory Visit: Payer: Self-pay | Admitting: *Deleted

## 2013-10-11 VITALS — BP 139/77 | HR 82 | Temp 100.2°F | Ht 68.0 in | Wt 182.4 lb

## 2013-10-11 DIAGNOSIS — J069 Acute upper respiratory infection, unspecified: Secondary | ICD-10-CM

## 2013-10-11 MED ORDER — BENZONATATE 100 MG PO CAPS: 100.0000 mg | ORAL_CAPSULE | Freq: Three times a day (TID) | ORAL | Status: DC | PRN

## 2013-10-11 MED ORDER — AZITHROMYCIN 250 MG PO TABS: ORAL_TABLET | ORAL | Status: DC

## 2013-10-11 MED ORDER — FLUCONAZOLE 150 MG PO TABS: 150.0000 mg | ORAL_TABLET | Freq: Once | ORAL | Status: DC

## 2013-10-11 MED ORDER — METHYLPREDNISOLONE ACETATE 80 MG/ML IJ SUSP
80.0000 mg | Freq: Once | INTRAMUSCULAR | Status: AC
Start: 2013-10-11 — End: 2013-10-11
  Administered 2013-10-11: 80 mg via INTRAMUSCULAR

## 2013-10-11 NOTE — Telephone Encounter (Signed)
appt made

## 2013-10-11 NOTE — Progress Notes (Signed)
   Subjective:    Patient ID: ARYN SAFRAN, female    DOB: 1961-05-10, 53 y.o.   MRN: [id number]  HPI This 53 y.o. female presents for evaluation of uri.   Review of Systems C/o uri No chest pain, SOB, HA, dizziness, vision change, N/V, diarrhea, constipation, dysuria, urinary urgency or frequency, myalgias, arthralgias or rash.     Objective:   Physical Exam Vital signs noted  Well developed well nourished female.  HEENT - Head atraumatic Normocephalic                Eyes - PERRLA, Conjuctiva - clear Sclera- Clear EOMI                Ears - EAC's Wnl TM's Wnl Gross Hearing WNL                Throat - oropharanx wnl Respiratory - Lungs CTA bilateral Cardiac - RRR S1 and S2 without murmur GI - Abdomen soft Nontender and bowel sounds active x 4 Extremities - No edema. Neuro - Grossly intact.       Assessment & Plan:  URI (upper respiratory infection) - Plan: azithromycin (ZITHROMAX) 250 MG tablet, methylPREDNISolone acetate (DEPO-MEDROL) injection 80 mg, benzonatate (TESSALON PERLES) 100 MG capsule  Push po fluids, rest, tylenol and motrin otc prn as directed for fever, arthralgias, and myalgias.  Follow up prn if sx's continue or persist.  Lysbeth Penner FNP

## 2013-12-18 ENCOUNTER — Encounter: Payer: Self-pay | Admitting: Family Medicine

## 2013-12-18 ENCOUNTER — Ambulatory Visit (INDEPENDENT_AMBULATORY_CARE_PROVIDER_SITE_OTHER): Payer: BC Managed Care – PPO | Admitting: Family Medicine

## 2013-12-18 VITALS — BP 164/81 | HR 67 | Temp 98.6°F | Ht 68.0 in | Wt 185.0 lb

## 2013-12-18 DIAGNOSIS — I10 Essential (primary) hypertension: Secondary | ICD-10-CM

## 2013-12-18 DIAGNOSIS — L03019 Cellulitis of unspecified finger: Secondary | ICD-10-CM

## 2013-12-18 DIAGNOSIS — L02519 Cutaneous abscess of unspecified hand: Secondary | ICD-10-CM

## 2013-12-18 DIAGNOSIS — R21 Rash and other nonspecific skin eruption: Secondary | ICD-10-CM

## 2013-12-18 MED ORDER — METHYLPREDNISOLONE (PAK) 4 MG PO TABS: ORAL_TABLET | ORAL | Status: DC

## 2013-12-18 MED ORDER — METHYLPREDNISOLONE ACETATE 80 MG/ML IJ SUSP
80.0000 mg | Freq: Once | INTRAMUSCULAR | Status: AC
  Administered 2013-12-18: 80 mg via INTRAMUSCULAR

## 2013-12-18 MED ORDER — CLOBETASOL PROPIONATE 0.05 % EX CREA: 1.0000 "application " | TOPICAL_CREAM | Freq: Two times a day (BID) | CUTANEOUS | Status: DC

## 2013-12-18 MED ORDER — HYDROXYZINE HCL 25 MG PO TABS: 25.0000 mg | ORAL_TABLET | Freq: Three times a day (TID) | ORAL | Status: DC | PRN

## 2013-12-18 MED ORDER — FLUCONAZOLE 150 MG PO TABS: 150.0000 mg | ORAL_TABLET | Freq: Once | ORAL | Status: DC

## 2013-12-18 MED ORDER — DOXYCYCLINE HYCLATE 100 MG PO TABS: 100.0000 mg | ORAL_TABLET | Freq: Two times a day (BID) | ORAL | Status: DC

## 2013-12-18 MED ORDER — MOEXIPRIL-HYDROCHLOROTHIAZIDE 15-25 MG PO TABS: 1.0000 | ORAL_TABLET | Freq: Every day | ORAL | Status: DC

## 2013-12-18 NOTE — Progress Notes (Signed)
   Subjective:    Patient ID: Brandy Smith, female    DOB: 1960/11/28, 53 y.o.   MRN: [id number]  HPI C/o diffuse rash all over and she has new laundry detergent   Review of Systems C/o rash No chest pain, SOB, HA, dizziness, vision change, N/V, diarrhea, constipation, dysuria, urinary urgency or frequency, myalgias, arthralgias.     Objective:   Physical Exam  Vital signs noted  Well developed well nourished female.  HEENT - Head atraumatic Normocephalic Respiratory - Lungs CTA bilateral Cardiac - RRR S1 and S2 without murmur GI - Abdomen soft Nontender and bowel sounds active x 4 Extremities - No edema. Neuro - Grossly intact. Skin - Macular papular rash spread out over neck, chest, abdomen, back, buttocks, legs     Assessment & Plan:  Cellulitis  - Plan: methylPREDNIsolone (MEDROL DOSPACK) 4 MG tablet, clobetasol cream (TEMOVATE) 0.05 %, methylPREDNISolone acetate (DEPO-MEDROL) injection 80 mg, hydrOXYzine (ATARAX/VISTARIL) 25 MG tablet, fluconazole (DIFLUCAN) 150 MG tablet, doxycycline (VIBRA-TABS) 100 MG tablet  Rash and nonspecific skin eruption - Plan: methylPREDNIsolone (MEDROL DOSPACK) 4 MG tablet, clobetasol cream (TEMOVATE) 0.05 %, methylPREDNISolone acetate (DEPO-MEDROL) injection 80 mg, hydrOXYzine (ATARAX/VISTARIL) 25 MG tablet, fluconazole (DIFLUCAN) 150 MG tablet, doxycycline (VIBRA-TABS) 100 MG tablet  Essential hypertension - Plan: moexipril-hydrochlorothiazide (UNIRETIC) 15-25 MG per tablet   Lysbeth Penner FNP

## 2013-12-26 ENCOUNTER — Other Ambulatory Visit: Payer: Self-pay | Admitting: Family Medicine

## 2013-12-26 ENCOUNTER — Telehealth: Payer: Self-pay | Admitting: *Deleted

## 2013-12-26 MED ORDER — LISINOPRIL-HYDROCHLOROTHIAZIDE 20-25 MG PO TABS: 1.0000 | ORAL_TABLET | Freq: Every day | ORAL | Status: DC

## 2013-12-26 NOTE — Telephone Encounter (Signed)
Received fax from pharmacy stating that their supplier has discontinued moexipril-hctz 15-25. They want to know if you will change to something else. Please advise

## 2013-12-26 NOTE — Telephone Encounter (Signed)
RX of lisinopril hct sent to pharmacy

## 2014-04-14 ENCOUNTER — Encounter: Payer: Self-pay | Admitting: Cardiovascular Disease

## 2014-10-28 ENCOUNTER — Other Ambulatory Visit: Payer: Self-pay

## 2014-10-28 DIAGNOSIS — Z1231 Encounter for screening mammogram for malignant neoplasm of breast: Secondary | ICD-10-CM

## 2014-10-29 ENCOUNTER — Ambulatory Visit
Admission: RE | Admit: 2014-10-29 | Discharge: 2014-10-29 | Disposition: A | Discharge status: Home / Self Care | Payer: BC Managed Care – PPO | Source: Ambulatory Visit

## 2014-10-29 DIAGNOSIS — Z1231 Encounter for screening mammogram for malignant neoplasm of breast: Secondary | ICD-10-CM

## 2015-10-13 ENCOUNTER — Other Ambulatory Visit: Payer: Self-pay

## 2015-10-13 DIAGNOSIS — Z1231 Encounter for screening mammogram for malignant neoplasm of breast: Secondary | ICD-10-CM

## 2015-11-02 ENCOUNTER — Ambulatory Visit
Admission: RE | Admit: 2015-11-02 | Discharge: 2015-11-02 | Disposition: A | Discharge status: Home / Self Care | Payer: BC Managed Care – PPO | Source: Ambulatory Visit

## 2015-11-02 DIAGNOSIS — Z1231 Encounter for screening mammogram for malignant neoplasm of breast: Secondary | ICD-10-CM

## 2016-07-09 ENCOUNTER — Emergency Department (HOSPITAL_COMMUNITY)
Admission: EM | Admit: 2016-07-09 | Discharge: 2016-07-09 | Disposition: A | Discharge status: Home / Self Care | Payer: BC Managed Care – PPO | Attending: Emergency Medicine | Admitting: Emergency Medicine

## 2016-07-09 ENCOUNTER — Encounter (HOSPITAL_COMMUNITY): Payer: Self-pay | Admitting: Emergency Medicine

## 2016-07-09 DIAGNOSIS — R200 Anesthesia of skin: Secondary | ICD-10-CM | POA: Diagnosis not present

## 2016-07-09 DIAGNOSIS — G5601 Carpal tunnel syndrome, right upper limb: Secondary | ICD-10-CM | POA: Diagnosis not present

## 2016-07-09 DIAGNOSIS — E119 Type 2 diabetes mellitus without complications: Secondary | ICD-10-CM | POA: Diagnosis not present

## 2016-07-09 NOTE — Discharge Instructions (Signed)
Please use your wrist splint until seen by orthopedic MD.

## 2016-07-09 NOTE — ED Triage Notes (Signed)
Patient c/o numbness from right mid forearm into palm of hand, right pinky, and ring finger. Per patient noticed this morning after waking up. Denies any injury. No neurological deficits noted. Denies any pain with flexion and extension of wrist.

## 2016-07-09 NOTE — ED Provider Notes (Signed)
[facility] DEPT Provider Note   CSN: [id number] Arrival date & time: 07/09/16  0747     History   Chief Complaint Chief Complaint  Patient presents with  . Numbness    HPI Brandy Smith is a 56 y.o. female.  Pt states she woke up this AM and noted numbness and tingling of the palm and the top of the right ring and pinky finger. She thought she may have slept in an difficult possition. She does not recall any recent injury or trauma to the right hand. It is of note that she does a lot of work with her fingers and wrist. She is not dropping objects, but states that the hand and fingers feel "funny". No other numbness appreciated. No history of any neck injury or problem. No recent operations or procedures involving the right upper extremity.   The history is provided by the patient.    Past Medical History:  Diagnosis Date  . Diabetes mellitus   . Diaphragmatic hernia without mention of obstruction or gangrene   . Diverticulosis of colon (without mention of hemorrhage)   . Family history of malignant neoplasm of gastrointestinal tract   . Personal history of colonic polyps   . Stricture and stenosis of esophagus   . Unspecified essential hypertension     Patient Active Problem List   Diagnosis Date Noted  . Anxiety 09/24/2010  . Chest pain 09/24/2010  . HYPERTENSION 09/11/2009  . SCHATZKI'S RING 09/11/2009  . HIATAL HERNIA 09/11/2009  . ESOPHAGEAL REFLUX 01/08/2008    Past Surgical History:  Procedure Laterality Date  . CESAREAN SECTION    . COLONOSCOPY     08/10/2004. 09/29/2009    OB History    Gravida Para Term Preterm AB Living   3 3 1 2   3    SAB TAB Ectopic Multiple Live Births                   Home Medications    Prior to Admission medications   Medication Sig Start Date End Date Taking? Authorizing Provider  clobetasol cream (TEMOVATE) [id number] % Apply 1 application topically 2 (two) times daily. 12/18/13   Lysbeth Penner, FNP  doxycycline  (VIBRA-TABS) 100 MG tablet Take 1 tablet (100 mg total) by mouth 2 (two) times daily. 12/18/13   Lysbeth Penner, FNP  fluconazole (DIFLUCAN) 150 MG tablet Take 1 tablet (150 mg total) by mouth once. 12/18/13   Lysbeth Penner, FNP  hydrOXYzine (ATARAX/VISTARIL) 25 MG tablet Take 1 tablet (25 mg total) by mouth 3 (three) times daily as needed. 12/18/13   Lysbeth Penner, FNP  lisinopril-hydrochlorothiazide (PRINZIDE,ZESTORETIC) 20-25 MG per tablet Take 1 tablet by mouth daily. 12/26/13   Lysbeth Penner, FNP  methylPREDNIsolone (MEDROL DOSPACK) 4 MG tablet follow package directions 12/18/13   Lysbeth Penner, FNP  moexipril-hydrochlorothiazide (UNIRETIC) 15-25 MG per tablet Take 1 tablet by mouth daily. 12/18/13   Lysbeth Penner, FNP    Family History Family History  Problem Relation Age of Onset  . Colon cancer Paternal Grandmother 23  . Diabetes Paternal Grandmother   . Pancreatic cancer Paternal Grandfather   . Diabetes      paternal side of family  . Irritable bowel syndrome Mother   . Cancer Father     Appendiceal    Social History Social History  Substance Use Topics  . Smoking status: Never Smoker  . Smokeless tobacco: Never Used  . Alcohol use No  Allergies   Patient has no known allergies.   Review of Systems Review of Systems  Neurological: Positive for numbness.  All other systems reviewed and are negative.    Physical Exam Updated Vital Signs BP 157/82 (BP Location: Left Arm)   Pulse 77   Temp 98.1 F (36.7 C) (Oral)   Resp 18   Ht 5\' 8"  (1.727 m)   Wt 81.6 kg   LMP 06/30/2008   SpO2 97%   BMI 27.37 kg/m   Physical Exam  Constitutional: She is oriented to person, place, and time. She appears well-developed and well-nourished.  Non-toxic appearance.  HENT:  Head: Normocephalic.  Right Ear: Tympanic membrane and external ear normal.  Left Ear: Tympanic membrane and external ear normal.  Eyes: EOM and lids are normal. Pupils are equal, round,  and reactive to light.  Neck: Normal range of motion. Neck supple. Carotid bruit is not present.  Cardiovascular: Normal rate, regular rhythm, normal heart sounds, intact distal pulses and normal pulses.   Pulmonary/Chest: Breath sounds normal. No respiratory distress.  Abdominal: Soft. Bowel sounds are normal. There is no tenderness. There is no guarding.  Musculoskeletal: Normal range of motion.  There is full range of motion of the right shoulder. There is full range of motion of the right elbow. Is no bruising or deformity of the forearm. This full range of motion of the wrist on the right. There is a positive Tinel's sign. This no atrophy or deformity of the thenar eminence. capillary refill is less than 3 seconds.  Lymphadenopathy:       Head (right side): No submandibular adenopathy present.       Head (left side): No submandibular adenopathy present.    She has no cervical adenopathy.  Neurological: She is alert and oriented to person, place, and time. She has normal strength. No cranial nerve deficit or sensory deficit. Coordination normal.  No weakness of shoulder shrugs. The grip is symmetrical. No gross neurologic deficits appreciated on examination.  Skin: Skin is warm and dry.  Psychiatric: She has a normal mood and affect. Her speech is normal.  Nursing note and vitals reviewed.    ED Treatments / Results  Labs (all labs ordered are listed, but only abnormal results are displayed) Labs Reviewed - No data to display  EKG  EKG Interpretation None       Radiology No results found.  Procedures Procedures (including critical care time)  Medications Ordered in ED Medications - No data to display   Initial Impression / Assessment and Plan / ED Course  I have reviewed the triage vital signs and the nursing notes.  Pertinent labs & imaging results that were available during my care of the patient were reviewed by me and considered in my medical decision making (see  chart for details).     **I have reviewed nursing notes, vital signs, and all appropriate lab and imaging results for this patient.*  Final Clinical Impressions(s) / ED Diagnoses  Blood pressure is elevated at 154/94, otherwise vital signs within normal limits. There is no gross neurologic deficit appreciated. There is a positive Tinel's sign, and examination favors carpal tunnel syndrome.  The patient will be fitted with a wrist cock-up splint. Patient will be treated with anti-inflammatory medication and steroid medication. Patient referred to orthopedics for additional evaluation and management of this issue.    Final diagnoses:  Carpal tunnel syndrome of right wrist    New Prescriptions There are no discharge medications for  this patient.    Lily Kocher, PA-C 07/11/16 [place], MD 07/12/16 [phone]

## 2016-09-23 ENCOUNTER — Telehealth: Payer: Self-pay | Admitting: *Deleted

## 2016-09-23 ENCOUNTER — Encounter: Payer: Self-pay | Admitting: Obstetrics & Gynecology

## 2016-09-23 ENCOUNTER — Ambulatory Visit (INDEPENDENT_AMBULATORY_CARE_PROVIDER_SITE_OTHER): Payer: BC Managed Care – PPO | Admitting: Obstetrics & Gynecology

## 2016-09-23 VITALS — BP 134/84 | Ht 66.0 in | Wt 179.0 lb

## 2016-09-23 DIAGNOSIS — Z1151 Encounter for screening for human papillomavirus (HPV): Secondary | ICD-10-CM

## 2016-09-23 DIAGNOSIS — N898 Other specified noninflammatory disorders of vagina: Secondary | ICD-10-CM | POA: Diagnosis not present

## 2016-09-23 DIAGNOSIS — N904 Leukoplakia of vulva: Secondary | ICD-10-CM

## 2016-09-23 DIAGNOSIS — N952 Postmenopausal atrophic vaginitis: Secondary | ICD-10-CM | POA: Diagnosis not present

## 2016-09-23 DIAGNOSIS — Z01411 Encounter for gynecological examination (general) (routine) with abnormal findings: Secondary | ICD-10-CM | POA: Diagnosis not present

## 2016-09-23 DIAGNOSIS — E663 Overweight: Secondary | ICD-10-CM

## 2016-09-23 LAB — WET PREP FOR TRICH, YEAST, CLUE
CLUE CELLS WET PREP: NONE SEEN
Trich, Wet Prep: NONE SEEN
Yeast Wet Prep HPF POC: NONE SEEN

## 2016-09-23 MED ORDER — ESTRADIOL 0.1 MG/GM VA CREA: 0.2500 | TOPICAL_CREAM | VAGINAL | 4 refills | Status: DC

## 2016-09-23 MED ORDER — CLOBETASOL PROPIONATE 0.05 % EX CREA: 1.0000 "application " | TOPICAL_CREAM | CUTANEOUS | 4 refills | Status: DC

## 2016-09-23 NOTE — Addendum Note (Signed)
Addended by: Thurnell Garbe A on: 09/23/2016 09:43 AM   Modules accepted: Orders

## 2016-09-23 NOTE — Patient Instructions (Addendum)
1. Encounter for gynecological examination with abnormal finding Normal gyn exam except as below.  Pap/HPV done. Schedule Mammo 10/2016.  BD f/u here.  Colono to schedule with [facility].  2. Vaginal discharge Possible yeast vaginitis.  Wet prep done.  Terazol cream if pos wet prep.  3. Lichen sclerosus of female genitalia Represcribe Clobetasol cream, continue twice a week.  4. Post-menopausal atrophic vaginitis  With Dyspareunia.  Start Estrace cream.  Usage and risks/benefits reviewed.  Apply twice a week.   5. Overweight, BMI 29+ Low Carb diet reviewed and physical activity.  Importance of wt loss discussed.  Last HBA1C 6.5 per patient, f/u ASAP with Fam MD Dr Shelia Media.  I will get back with you as soon as the results are available.  I encourage you to start with a low Carb diet, like [employer], and exercise regularly.   It was a pleasure to see you today!

## 2016-09-23 NOTE — Progress Notes (Signed)
Brandy Smith 1961/03/03 [id number]   History:    56 y.o. G3P3L3 Married.  Established patient presenting for annual gyn exam  Menopause.  No HRT.  No PMB.  No pelvic pain.  C/O increased white vaginal d/c.  Minimal vulvar discomfort on Clobetasol cream twice a week for Lichen Sclerosus of the Vulva.  C/O Vaginal dryness and pain with IC.  Using only [place] currently.  Breasts wnl.  No significant SUI.  Past medical history,surgical history, family history and social history were all reviewed and documented in the EPIC chart.  Gynecologic History Patient's last menstrual period was 06/30/2008. Contraception: post menopausal status Last Pap: 2017. Results were: normal Last mammogram: 10/2015. Results were: normal  Obstetric History OB History  Gravida Para Term Preterm AB Living  3 3 1 2   3   SAB TAB Ectopic Multiple Live Births               # Outcome Date GA Lbr Len/2nd Weight Sex Delivery Anes PTL Lv  3 Term           2 Preterm           1 Preterm                ROS: A ROS was performed and pertinent positives and negatives are included in the history.  GENERAL: No fevers or chills. HEENT: No change in vision, no earache, sore throat or sinus congestion. NECK: No pain or stiffness. CARDIOVASCULAR: No chest pain or pressure. No palpitations. PULMONARY: No shortness of breath, cough or wheeze. GASTROINTESTINAL: No abdominal pain, nausea, vomiting or diarrhea, melena or bright red blood per rectum. GENITOURINARY: No urinary frequency, urgency, hesitancy or dysuria. MUSCULOSKELETAL: No joint or muscle pain, no back pain, no recent trauma. DERMATOLOGIC: No rash, no itching, no lesions. ENDOCRINE: No polyuria, polydipsia, no heat or cold intolerance. No recent change in weight. HEMATOLOGICAL: No anemia or easy bruising or bleeding. NEUROLOGIC: No headache, seizures, numbness, tingling or weakness. PSYCHIATRIC: No depression, no loss of interest in normal activity or change in sleep  pattern.     Exam:   BP 134/84   Ht 5\' 6"  (1.676 m)   Wt 179 lb (81.2 kg)   LMP 06/30/2008   BMI 28.89 kg/m   Body mass index is 28.89 kg/m.  General appearance : Well developed well nourished female. No acute distress HEENT: Eyes: no retinal hemorrhage or exudates,  Neck supple, trachea midline, no carotid bruits, no thyroidmegaly Lungs: Clear to auscultation, no rhonchi or wheezes, or rib retractions  Heart: Regular rate and rhythm, no murmurs or gallops Breast:Examined in sitting and supine position were symmetrical in appearance, no palpable masses or tenderness,  no skin retraction, no nipple inversion, no nipple discharge, no skin discoloration, no axillary or supraclavicular lymphadenopathy Abdomen: no palpable masses or tenderness, no rebound or guarding Extremities: no edema or skin discoloration or tenderness  Pelvic:  Bartholin, Urethra, Skene Glands: Within normal limits             Vagina: No gross lesions.  Increased white discharge.  Wet prep done.  Cervix: No gross lesions or discharge.  Pap/HPV done.  Uterus  AV, normal size, shape and consistency, non-tender and mobile  Adnexa  Without masses or tenderness  Vulva with atrophy and erythema all the way to the peri-anal area.   Assessment/Plan:  56 y.o. female for annual exam    1. Encounter for gynecological examination with abnormal finding Normal gyn  exam except as below.  Pap/HPV done. Schedule Mammo 10/2016.  BD f/u here.  Colono to schedule with [facility].  2. Vaginal discharge Possible yeast vaginitis.  Wet prep done.  Terazol cream if pos wet prep.  Wet prep was negative.  3. Lichen sclerosus of female genitalia Represcribe Clobetasol cream, continue twice a week.  4. Post-menopausal atrophic vaginitis  With Dyspareunia.  Start Estrace cream.  Usage and risks/benefits reviewed.  Apply twice a week.   5. Overweight, BMI 29+ Low Carb diet reviewed and physical activity.  Importance of wt loss  discussed.  Last HBA1C 6.5 per patient, f/u ASAP with Fam MD Dr Shelia Media.  Counseling on above issues >50% x 15 min.  Princess Bruins MD, 8:37 AM 09/23/2016

## 2016-09-23 NOTE — Telephone Encounter (Signed)
Pt informed wet prep negative therefore no prescription sent. Call if Sxs persist or worsen. KW CMA

## 2016-09-27 ENCOUNTER — Other Ambulatory Visit: Payer: Self-pay | Admitting: Gynecology

## 2016-09-27 DIAGNOSIS — Z780 Asymptomatic menopausal state: Secondary | ICD-10-CM

## 2016-09-27 DIAGNOSIS — Z13820 Encounter for screening for osteoporosis: Secondary | ICD-10-CM

## 2016-09-28 ENCOUNTER — Ambulatory Visit (INDEPENDENT_AMBULATORY_CARE_PROVIDER_SITE_OTHER): Payer: BC Managed Care – PPO | Admitting: Obstetrics & Gynecology

## 2016-09-28 VITALS — BP 142/88

## 2016-09-28 DIAGNOSIS — N72 Inflammatory disease of cervix uteri: Secondary | ICD-10-CM

## 2016-09-28 LAB — PAP IG AND HPV HIGH-RISK: HPV DNA HIGH RISK: NOT DETECTED

## 2016-09-28 LAB — WET PREP FOR TRICH, YEAST, CLUE
CLUE CELLS WET PREP: NONE SEEN
TRICH WET PREP: NONE SEEN
Yeast Wet Prep HPF POC: NONE SEEN

## 2016-09-28 NOTE — Patient Instructions (Signed)
Your Gyn exam was normal today.  The Wet prep was negative.  Gono and Chlam testing is pending.  I recommend Probiotic tab vaginally once a week to see if by improving your vaginal flora, your secretions would decrease vaginally.

## 2016-09-28 NOTE — Progress Notes (Signed)
    Brandy Smith Sep 30, 1960 [id number]        56 y.o.  [id number]   RP:  Inflammation on Pap  Whitish vaginal d/c, no itching, no odor.  No pelvic pain.  Last STI screen neg 10/2015 per patient.  Past medical history,surgical history, problem list, medications, allergies, family history and social history were all reviewed and documented in the EPIC chart.  Directed ROS with pertinent positives and negatives documented in the history of present illness/assessment and plan.  Exam:  Vitals:   09/28/16 1203  BP: (!) 142/88   General appearance:  Normal  Gyn exam:  Vulva normal                     Speculum:  Cervix, vagina normal.  Mild whitish secretions.  Wet prep and Gono-Chlam done.  Assessment/Plan:  56 y.o. [id number]   1. Inflammation of cervix Probably associated with Menopause/Atrophy.  Wet prep neg.  Pending Gono-Chlam.  Reassured.  Will try vaginal tab of Probiotic qweek.  - WET PREP FOR TRICH, YEAST, CLUE - GC/Chlamydia Probe Amp  Counseling >50% x 15 min.  Princess Bruins MD, 12:20 PM 09/28/2016

## 2016-09-29 LAB — GC/CHLAMYDIA PROBE AMP
CT Probe RNA: NOT DETECTED
GC PROBE AMP APTIMA: NOT DETECTED

## 2016-11-09 ENCOUNTER — Other Ambulatory Visit: Payer: Self-pay | Admitting: Obstetrics & Gynecology

## 2016-11-09 DIAGNOSIS — Z1231 Encounter for screening mammogram for malignant neoplasm of breast: Secondary | ICD-10-CM

## 2016-11-28 ENCOUNTER — Ambulatory Visit
Admission: RE | Admit: 2016-11-28 | Discharge: 2016-11-28 | Disposition: A | Discharge status: Home / Self Care | Payer: BC Managed Care – PPO | Source: Ambulatory Visit | Attending: Obstetrics & Gynecology | Admitting: Obstetrics & Gynecology

## 2016-11-28 DIAGNOSIS — Z1231 Encounter for screening mammogram for malignant neoplasm of breast: Secondary | ICD-10-CM

## 2017-05-18 DIAGNOSIS — E559 Vitamin D deficiency, unspecified: Secondary | ICD-10-CM | POA: Diagnosis not present

## 2017-05-18 DIAGNOSIS — I10 Essential (primary) hypertension: Secondary | ICD-10-CM | POA: Diagnosis not present

## 2017-05-18 DIAGNOSIS — E119 Type 2 diabetes mellitus without complications: Secondary | ICD-10-CM | POA: Diagnosis not present

## 2017-06-06 DIAGNOSIS — E559 Vitamin D deficiency, unspecified: Secondary | ICD-10-CM | POA: Diagnosis not present

## 2017-06-06 DIAGNOSIS — Z800 Family history of malignant neoplasm of digestive organs: Secondary | ICD-10-CM | POA: Diagnosis not present

## 2017-06-06 DIAGNOSIS — Z0000 Encounter for general adult medical examination without abnormal findings: Secondary | ICD-10-CM | POA: Diagnosis not present

## 2017-06-08 ENCOUNTER — Telehealth: Payer: Self-pay | Admitting: Internal Medicine

## 2017-06-08 NOTE — Telephone Encounter (Signed)
Ok, and she can be scheduled an OV

## 2017-06-09 ENCOUNTER — Encounter: Payer: Self-pay | Admitting: Internal Medicine

## 2017-06-09 NOTE — Telephone Encounter (Signed)
Left message for patient to call back and schedule an office visit with Dr.Pyrtle.

## 2017-07-04 DIAGNOSIS — E1165 Type 2 diabetes mellitus with hyperglycemia: Secondary | ICD-10-CM | POA: Diagnosis not present

## 2017-07-04 DIAGNOSIS — I10 Essential (primary) hypertension: Secondary | ICD-10-CM | POA: Diagnosis not present

## 2017-07-12 ENCOUNTER — Encounter: Payer: Self-pay | Admitting: *Deleted

## 2017-07-31 ENCOUNTER — Ambulatory Visit (INDEPENDENT_AMBULATORY_CARE_PROVIDER_SITE_OTHER): Payer: BC Managed Care – PPO | Admitting: Internal Medicine

## 2017-07-31 ENCOUNTER — Encounter: Payer: Self-pay | Admitting: Internal Medicine

## 2017-07-31 VITALS — BP 120/70 | HR 73 | Ht 66.0 in | Wt 171.0 lb

## 2017-07-31 DIAGNOSIS — Z800 Family history of malignant neoplasm of digestive organs: Secondary | ICD-10-CM | POA: Diagnosis not present

## 2017-07-31 MED ORDER — SUPREP BOWEL PREP KIT 17.5-3.13-1.6 GM/177ML PO SOLN: 1.0000 | ORAL | 0 refills | Status: DC

## 2017-07-31 NOTE — Progress Notes (Signed)
Patient ID: Brandy Smith, female   DOB: 1960-07-24, 57 y.o.   MRN: [id number] HPI: Brandy Smith is a 57 year old female with a history of GERD, colonic diverticulosis, hypertension, diabetes who was seen in consult at the request of Dr. Shelia Media to consider elevated risk screening colonoscopy.  She presents alone today.  She reports that her father died of appendiceal cancer at age 41, her paternal grandmother had colon cancer at 58.  She had a screening colonoscopy performed by Dr. Deatra Ina on 09/29/2009.  This revealed a 2 mm diminutive polyp in the sigmoid and was otherwise a normal exam.  This was a ganglioneuroma but no adenomatous polyp was found.  She reports no change in bowel habit other than associated with her diabetes medication.  She developed severe diarrhea with metformin and this was changed to glipizide.  With glipizide she has had some mild constipation.  She is having a bowel movement 2-3 days/week.  No blood in her stool or melena.  No abdominal pain.  She does have a history of GERD and reports 2-3 times a year she will develop substernal chest pain which she knows to be reflux.  She is been seen in the ER for this on several occasions and symptoms always relieved quickly with GI cocktail.  No dysphagia or odynophagia.  Past Medical History:  Diagnosis Date  . Anxiety   . Diabetes ([place])   . Diaphragmatic hernia without mention of obstruction or gangrene   . Diverticulosis of colon (without mention of hemorrhage)   . GERD (gastroesophageal reflux disease)   . Hypertension   . Lichen sclerosus   . Stricture and stenosis of esophagus   . Unspecified essential hypertension   . Vitamin D deficiency     Past Surgical History:  Procedure Laterality Date  . CESAREAN SECTION    . COLONOSCOPY     08/10/2004. 09/29/2009    Outpatient Medications Prior to Visit  Medication Sig Dispense Refill  . clobetasol cream (TEMOVATE) 8.41 % Apply 1 application topically 2 (two) times a  week. Thin vulvar application 30 g 4  . estradiol (ESTRACE VAGINAL) 0.1 MG/GM vaginal cream Place 6.60 Applicatorfuls vaginally 2 (two) times a week. 42.5 g 4  . glipiZIDE (GLUCOTROL XL) 5 MG 24 hr tablet Take 5 mg by mouth daily with breakfast.    . lisinopril-hydrochlorothiazide (PRINZIDE,ZESTORETIC) 20-25 MG tablet Take 1 tablet by mouth daily.    Marland Kitchen glipiZIDE (GLUCOTROL) 10 MG tablet Take 10 mg by mouth daily before breakfast.     No facility-administered medications prior to visit.     No Known Allergies  Family History  Problem Relation Age of Onset  . Colon cancer Paternal Grandmother 44  . Diabetes Paternal Grandmother   . Pancreatic cancer Paternal Grandfather   . Irritable bowel syndrome Mother   . Uterine cancer Mother   . Osteoporosis Mother   . Cancer Father        Appendiceal  . Colon cancer      Social History   Tobacco Use  . Smoking status: Never Smoker  . Smokeless tobacco: Never Used  Substance Use Topics  . Alcohol use: No  . Drug use: No    ROS: As per history of present illness, otherwise negative  BP 120/70   Pulse 73   Ht 5' 6"  (1.676 m)   Wt 171 lb (77.6 kg)   LMP 06/30/2008   BMI 27.60 kg/m  Constitutional: Well-developed and well-nourished. No distress. HEENT: Normocephalic and atraumatic.  Conjunctivae are normal.  No scleral icterus. Neck: Neck supple. Trachea midline. Cardiovascular: Normal rate, regular rhythm and intact distal pulses. No M/R/G Pulmonary/chest: Effort normal and breath sounds normal. No wheezing, rales or rhonchi. Abdominal: Soft, nontender, nondistended. Bowel sounds active throughout. There are no masses palpable. No hepatosplenomegaly. Extremities: no clubbing, cyanosis, or edema Neurological: Alert and oriented to person place and time. Skin: Skin is warm and dry.  Psychiatric: Normal mood and affect. Behavior is normal.  RELEVANT LABS AND IMAGING: CBC    Component Value Date/Time   WBC 7.7 02/02/2011 1523    RBC 4.49 02/02/2011 1523   HGB 13.5 02/02/2011 1523   HCT 39.9 02/02/2011 1523   PLT 249.0 02/02/2011 1523   MCV 88.8 02/02/2011 1523   MCH 30.1 03/11/2010 1334   MCHC 33.8 02/02/2011 1523   RDW 13.8 02/02/2011 1523   LYMPHSABS 2.2 02/02/2011 1523   MONOABS 0.6 02/02/2011 1523   EOSABS 0.2 02/02/2011 1523   BASOSABS 0.0 02/02/2011 1523    CMP     Component Value Date/Time   NA 142 02/02/2011 1523   K 3.6 02/02/2011 1523   CL 102 02/02/2011 1523   CO2 29 02/02/2011 1523   GLUCOSE 105 (H) 02/02/2011 1523   BUN 10 02/02/2011 1523   CREATININE 0.5 02/02/2011 1523   CALCIUM 9.5 02/02/2011 1523   PROT 7.6 02/02/2011 1523   ALBUMIN 4.3 02/02/2011 1523   AST 23 02/02/2011 1523   ALT 30 02/02/2011 1523   ALKPHOS 49 02/02/2011 1523   BILITOT 1.4 (H) 02/02/2011 1523   GFRNONAA >60 03/11/2010 1334   GFRAA  03/11/2010 1334    >60        The eGFR has been calculated using the MDRD equation. This calculation has not been validated in all clinical situations. eGFR's persistently <60 mL/min signify possible Chronic Kidney Disease.    ASSESSMENT/PLAN: 57 year old female with a history of GERD, colonic diverticulosis, hypertension, diabetes who was seen in consult at the request of Dr. Shelia Media to consider elevated risk screening colonoscopy.  1.  Family history of appendiceal cancer father/family history of colon cancer paternal grandmother --based on her family history I recommended that we proceed with elevated risk, repeat colon cancer screening at this time with colonoscopy.  We discussed the risk, benefits and alternatives and she is agreeable and wishes to proceed.  CC: Dr. Janne Napoleon, [facility] [address] [place], [place] [address]

## 2017-07-31 NOTE — Patient Instructions (Signed)
You have been scheduled for a colonoscopy. Please follow written instructions given to you at your visit today.  Please pick up your prep supplies at the pharmacy within the next 1-3 days. If you use inhalers (even only as needed), please bring them with you on the day of your procedure. Your physician has requested that you go to www.startemmi.com and enter the access code given to you at your visit today. This web site gives a general overview about your procedure. However, you should still follow specific instructions given to you by our office regarding your preparation for the procedure.  If you are age 28 or older, your body mass index should be between 23-30. Your Body mass index is 27.6 kg/m. If this is out of the aforementioned range listed, please consider follow up with your Primary Care Provider.  If you are age 24 or younger, your body mass index should be between 19-25. Your Body mass index is 27.6 kg/m. If this is out of the aformentioned range listed, please consider follow up with your Primary Care Provider.

## 2017-08-09 DIAGNOSIS — I10 Essential (primary) hypertension: Secondary | ICD-10-CM | POA: Diagnosis not present

## 2017-08-09 DIAGNOSIS — E1165 Type 2 diabetes mellitus with hyperglycemia: Secondary | ICD-10-CM | POA: Diagnosis not present

## 2017-08-09 DIAGNOSIS — E559 Vitamin D deficiency, unspecified: Secondary | ICD-10-CM | POA: Diagnosis not present

## 2017-08-15 ENCOUNTER — Ambulatory Visit (INDEPENDENT_AMBULATORY_CARE_PROVIDER_SITE_OTHER): Payer: BC Managed Care – PPO

## 2017-08-15 ENCOUNTER — Ambulatory Visit (INDEPENDENT_AMBULATORY_CARE_PROVIDER_SITE_OTHER): Payer: BC Managed Care – PPO | Admitting: Obstetrics & Gynecology

## 2017-08-15 ENCOUNTER — Encounter: Payer: Self-pay | Admitting: Obstetrics & Gynecology

## 2017-08-15 DIAGNOSIS — N950 Postmenopausal bleeding: Secondary | ICD-10-CM

## 2017-08-15 DIAGNOSIS — R300 Dysuria: Secondary | ICD-10-CM | POA: Diagnosis not present

## 2017-08-15 NOTE — Progress Notes (Signed)
    Brandy Smith 08-31-1960 [id number]        57 y.o.  [id number]   RP: Mild PMB 3 days ago  HPI: Blood mixed with vaginal secretions 3 days ago.  Menopausal x many years, not on HRT.  No pelvic cramping or pain.  No recent IC, last time was many months ago.  No UTI Sx, no blood in urine.  BMs normal, no rectal bleeding.  Dx of DM in the past year, followed by Dr Shelia Media.  Improving Glucose control on Glipizide.   OB History  Gravida Para Term Preterm AB Living  3 3 1 2   3   SAB TAB Ectopic Multiple Live Births               # Outcome Date GA Lbr Len/2nd Weight Sex Delivery Anes PTL Lv  3 Term           2 Preterm           1 Preterm               Past medical history,surgical history, problem list, medications, allergies, family history and social history were all reviewed and documented in the EPIC chart.   Directed ROS with pertinent positives and negatives documented in the history of present illness/assessment and plan.  Exam:  There were no vitals filed for this visit. General appearance:  Normal  Abdomen: Soft, NT  Gynecologic exam: Vulva normal.  Speculum:  Cervix normal, no lesion, no polyp.  Vagina normal.  Normal secretions.  Bimanual exam:  Normal AV Uterus, mobile, NT.  No adnexal mass, NT.  U/A: Cloudy, nitrites negative, white blood cells 10-20, red blood cells none, bacteria moderate.  Urine culture pending.  Pelvic US today: T/V images.  Uterus anteverted heterogeneous measuring 6.59 x 4.85 x 2.91 cm.  Endometrial line normal at 3.4 mm.  Right ovary with a thick walled collapsed follicle measuring 6 x 7 mm.  Negative color flow Doppler.  Left ovary normal.  No apparent mass in the right and left adnexa.  No free fluid in the posterior cul-de-sac.    Assessment/Plan:  57 y.o. [id number]   1. Postmenopausal bleeding Normal gynecologic exam. Pelvic US to r/o Endometrial Polyps, SM Myomas, Endometrial Hyperplasia and Endometrial Cancer.  Differential Dx reviewed  with patient.  Discussed that if the Endometrial lining is thickened, further testing would be needed, probably an Endometrial Biopsy.  Patient back in the afternoon for the pelvic ultrasound.  Informed that her postmenopausal vaginal bleeding might have been associated with an isolated ovulation given the follicle seen on the right ovary.  The endometrial lining is currently normal and very thin at 3.9 mm.  Patient reassured that this confirms no endometrial pathology.  Will therefore observe and patient will call back if has recurrence of postmenopausal vaginal bleeding.  - US Transvaginal Non-OB  2. Dysuria Urine analysis mildly abnormal.  Pending urine culture.  Will treat her urine culture results. - Urinalysis,Complete w/RFL Culture - REFLEXIVE URINE CULTURE - Urine Culture  Counseling on above issues and coordination of care >50% for 25 minutes.  Princess Bruins MD, 10:48 AM 08/15/2017

## 2017-08-16 ENCOUNTER — Encounter: Payer: Self-pay | Admitting: Internal Medicine

## 2017-08-16 ENCOUNTER — Other Ambulatory Visit: Payer: Self-pay

## 2017-08-16 ENCOUNTER — Ambulatory Visit (AMBULATORY_SURGERY_CENTER): Payer: BC Managed Care – PPO | Admitting: Internal Medicine

## 2017-08-16 ENCOUNTER — Encounter: Payer: Self-pay | Admitting: Obstetrics & Gynecology

## 2017-08-16 VITALS — BP 142/74 | HR 63 | Temp 98.2°F | Resp 14 | Ht 66.0 in | Wt 171.0 lb

## 2017-08-16 DIAGNOSIS — Z1211 Encounter for screening for malignant neoplasm of colon: Secondary | ICD-10-CM | POA: Diagnosis not present

## 2017-08-16 DIAGNOSIS — K635 Polyp of colon: Secondary | ICD-10-CM

## 2017-08-16 DIAGNOSIS — Z800 Family history of malignant neoplasm of digestive organs: Secondary | ICD-10-CM | POA: Diagnosis present

## 2017-08-16 DIAGNOSIS — D123 Benign neoplasm of transverse colon: Secondary | ICD-10-CM

## 2017-08-16 DIAGNOSIS — D125 Benign neoplasm of sigmoid colon: Secondary | ICD-10-CM

## 2017-08-16 MED ORDER — SODIUM CHLORIDE 0.9 % IV SOLN: 500.0000 mL | Freq: Once | INTRAVENOUS | Status: DC

## 2017-08-16 NOTE — Progress Notes (Signed)
Pt up to the restroom.  IV pulled around IV pole.  I open IV fluid up and the IV was positional.  I decided to removed IV and restart new IV.  maw

## 2017-08-16 NOTE — Progress Notes (Signed)
Called to room to assist during endoscopic procedure.  Patient ID and intended procedure confirmed with present staff. Received instructions for my participation in the procedure from the performing physician.  

## 2017-08-16 NOTE — Patient Instructions (Addendum)
1. Postmenopausal bleeding Normal gynecologic exam. Pelvic US to r/o Endometrial Polyps, SM Myomas, Endometrial Hyperplasia and Endometrial Cancer.  Differential Dx reviewed with patient.  Discussed that if the Endometrial lining is thickened, further testing would be needed, probably an Endometrial Biopsy.  Patient back in the afternoon for the pelvic ultrasound.  Informed that her postmenopausal vaginal bleeding might have been associated with an isolated ovulation given the follicle seen on the right ovary.  The endometrial lining is currently normal and very thin at 3.9 mm.  Patient reassured that this confirms no endometrial pathology.  Will therefore observe and patient will call back if has recurrence of postmenopausal vaginal bleeding.  - US Transvaginal Non-OB  2. Dysuria Urine analysis mildly abnormal.  Pending urine culture.  Will treat her urine culture results. - Urinalysis,Complete w/RFL Culture - REFLEXIVE URINE CULTURE - Urine Culture  Reily, it was a pleasure seeing you today!

## 2017-08-16 NOTE — Patient Instructions (Signed)
YOU HAD AN ENDOSCOPIC PROCEDURE TODAY AT [facility]:   Refer to the procedure report that was given to you for any specific questions about what was found during the examination.  If the procedure report does not answer your questions, please call your gastroenterologist to clarify.  If you requested that your care partner not be given the details of your procedure findings, then the procedure report has been included in a sealed envelope for you to review at your convenience later.  YOU SHOULD EXPECT: Some feelings of bloating in the abdomen. Passage of more gas than usual.  Walking can help get rid of the air that was put into your GI tract during the procedure and reduce the bloating. If you had a lower endoscopy (such as a colonoscopy or flexible sigmoidoscopy) you may notice spotting of blood in your stool or on the toilet paper. If you underwent a bowel prep for your procedure, you may not have a normal bowel movement for a few days.  Please Note:  You might notice some irritation and congestion in your nose or some drainage.  This is from the oxygen used during your procedure.  There is no need for concern and it should clear up in a day or so.  SYMPTOMS TO REPORT IMMEDIATELY:   Following lower endoscopy (colonoscopy or flexible sigmoidoscopy):  Excessive amounts of blood in the stool  Significant tenderness or worsening of abdominal pains  Swelling of the abdomen that is new, acute  Fever of 100F or higher  Please see handouts given to you on Diverticulosis and polyps.  For urgent or emergent issues, a gastroenterologist can be reached at any hour by calling [phone].   DIET:  We do recommend a small meal at first, but then you may proceed to your regular diet.  Drink plenty of fluids but you should avoid alcoholic beverages for 24 hours.  ACTIVITY:  You should plan to take it easy for the rest of today and you should NOT DRIVE or use heavy machinery until  tomorrow (because of the sedation medicines used during the test).    FOLLOW UP: Our staff will call the number listed on your records the next business day following your procedure to check on you and address any questions or concerns that you may have regarding the information given to you following your procedure. If we do not reach you, we will leave a message.  However, if you are feeling well and you are not experiencing any problems, there is no need to return our call.  We will assume that you have returned to your regular daily activities without incident.  If any biopsies were taken you will be contacted by phone or by letter within the next 1-3 weeks.  Please call us at [phone] if you have not heard about the biopsies in 3 weeks.    SIGNATURES/CONFIDENTIALITY: You and/or your care partner have signed paperwork which will be entered into your electronic medical record.  These signatures attest to the fact that that the information above on your After Visit Summary has been reviewed and is understood.  Full responsibility of the confidentiality of this discharge information lies with you and/or your care-partner.  Thank you for letting us take care of your healthcare needs today.

## 2017-08-16 NOTE — Op Note (Signed)
[place] Patient Name: Brandy Smith Procedure Date: 08/16/2017 3:59 PM MRN: [id number] Endoscopist: Jerene Bears , MD Age: 57 Referring MD:  Date of Birth: 1960-09-10 Gender: Female Account #: [id number] Procedure:                Colonoscopy Indications:              Screening in patient at increased risk: Family                            history of 1st-degree relative with colorectal                            cancer Medicines:                Monitored Anesthesia Care Procedure:                Pre-Anesthesia Assessment:                           - Prior to the procedure, a History and Physical                            was performed, and patient medications and                            allergies were reviewed. The patient's tolerance of                            previous anesthesia was also reviewed. The risks                            and benefits of the procedure and the sedation                            options and risks were discussed with the patient.                            All questions were answered, and informed consent                            was obtained. Prior Anticoagulants: The patient has                            taken no previous anticoagulant or antiplatelet                            agents. ASA Grade Assessment: II - A patient with                            mild systemic disease. After reviewing the risks                            and benefits, the patient was deemed in  satisfactory condition to undergo the procedure.                           After obtaining informed consent, the colonoscope                            was passed under direct vision. Throughout the                            procedure, the patient's blood pressure, pulse, and                            oxygen saturations were monitored continuously. The                            Colonoscope was introduced through the anus and                advanced to the the cecum, identified by                            appendiceal orifice and ileocecal valve. The                            colonoscopy was performed without difficulty. The                            patient tolerated the procedure well. The quality                            of the bowel preparation was good. The ileocecal                            valve, appendiceal orifice, and rectum were                            photographed. Scope In: 4:21:41 PM Scope Out: 4:38:14 PM Scope Withdrawal Time: 0 hours 13 minutes 52 seconds  Total Procedure Duration: 0 hours 16 minutes 33 seconds  Findings:                 The digital rectal exam was normal.                           Two sessile polyps were found in the transverse                            colon. The polyps were 4 to 5 mm in size. These                            polyps were removed with a cold snare. Resection                            and retrieval were complete.  A 3 mm polyp was found in the sigmoid colon on the                            edge of a diverticulum. The polyp was sessile. The                            polyp was removed with a cold biopsy forceps.                            Resection and retrieval were complete.                           Multiple small and large-mouthed diverticula were                            found in the sigmoid colon and descending colon.                           The retroflexed view of the distal rectum and anal                            verge was normal and showed no anal or rectal                            abnormalities. Complications:            No immediate complications. Estimated Blood Loss:     Estimated blood loss was minimal. Impression:               - Two 4 to 5 mm polyps in the transverse colon,                            removed with a cold snare. Resected and retrieved.                           - One 3 mm polyp in the  sigmoid colon, removed with                            a cold biopsy forceps. Resected and retrieved.                           - Mild diverticulosis in the sigmoid colon and in                            the descending colon.                           - The distal rectum and anal verge are normal on                            retroflexion view. Recommendation:           - Patient has a contact number available for  emergencies. The signs and symptoms of potential                            delayed complications were discussed with the                            patient. Return to normal activities tomorrow.                            Written discharge instructions were provided to the                            patient.                           - Resume previous diet.                           - Continue present medications.                           - Await pathology results.                           - Repeat colonoscopy is recommended. The                            colonoscopy date will be determined after pathology                            results from today's exam become available for                            review. Jerene Bears, MD 08/16/2017 4:41:42 PM This report has been signed electronically.

## 2017-08-16 NOTE — Progress Notes (Signed)
Report given to PACU, vss 

## 2017-08-17 ENCOUNTER — Telehealth: Payer: Self-pay

## 2017-08-17 ENCOUNTER — Telehealth: Payer: Self-pay | Admitting: *Deleted

## 2017-08-17 LAB — URINALYSIS, COMPLETE W/RFL CULTURE
BILIRUBIN URINE: NEGATIVE
Glucose, UA: NEGATIVE
HYALINE CAST: NONE SEEN /LPF
Hgb urine dipstick: NEGATIVE
KETONES UR: NEGATIVE
Nitrites, Initial: NEGATIVE
PH: 5 (ref 5.0–8.0)
Protein, ur: NEGATIVE
RBC / HPF: NONE SEEN /HPF (ref 0–2)
SPECIFIC GRAVITY, URINE: 1.004 (ref 1.001–1.03)

## 2017-08-17 LAB — URINE CULTURE
MICRO NUMBER: 90350668
SPECIMEN QUALITY:: ADEQUATE

## 2017-08-17 LAB — CULTURE INDICATED

## 2017-08-17 NOTE — Telephone Encounter (Signed)
  Follow up Call-  Call back number 08/16/2017  Post procedure Call Back phone  # [phone]  Permission to leave phone message Yes  Some recent data might be hidden     Left message

## 2017-08-17 NOTE — Telephone Encounter (Signed)
  Follow up Call-  Call back number 08/16/2017  Post procedure Call Back phone  # [phone]  Permission to leave phone message Yes  Some recent data might be hidden     Patient questions:  Message left to call us if necessary.

## 2017-08-18 ENCOUNTER — Encounter: Payer: Self-pay | Admitting: *Deleted

## 2017-08-21 ENCOUNTER — Ambulatory Visit: Payer: BC Managed Care – PPO | Admitting: Obstetrics & Gynecology

## 2017-08-23 ENCOUNTER — Encounter: Payer: Self-pay | Admitting: Internal Medicine

## 2017-09-15 ENCOUNTER — Emergency Department (HOSPITAL_COMMUNITY)
Admission: EM | Admit: 2017-09-15 | Discharge: 2017-09-15 | Disposition: A | Discharge status: Home / Self Care | Payer: BC Managed Care – PPO | Attending: Emergency Medicine | Admitting: Emergency Medicine

## 2017-09-15 ENCOUNTER — Other Ambulatory Visit: Payer: Self-pay

## 2017-09-15 DIAGNOSIS — E119 Type 2 diabetes mellitus without complications: Secondary | ICD-10-CM | POA: Diagnosis not present

## 2017-09-15 DIAGNOSIS — R21 Rash and other nonspecific skin eruption: Secondary | ICD-10-CM | POA: Diagnosis present

## 2017-09-15 DIAGNOSIS — L2489 Irritant contact dermatitis due to other agents: Secondary | ICD-10-CM | POA: Diagnosis not present

## 2017-09-15 DIAGNOSIS — Z7984 Long term (current) use of oral hypoglycemic drugs: Secondary | ICD-10-CM | POA: Insufficient documentation

## 2017-09-15 DIAGNOSIS — I10 Essential (primary) hypertension: Secondary | ICD-10-CM | POA: Insufficient documentation

## 2017-09-15 DIAGNOSIS — Z79899 Other long term (current) drug therapy: Secondary | ICD-10-CM | POA: Diagnosis not present

## 2017-09-15 DIAGNOSIS — L245 Irritant contact dermatitis due to other chemical products: Secondary | ICD-10-CM

## 2017-09-15 NOTE — ED Triage Notes (Signed)
Pt arrives with tick on back of right leg behind knee. Pt also has a rash on bilateral ankles. Rash does not itch, is not raised. Pt states she helped her husband put out mulch and spray chemicals Wednesday.

## 2017-09-15 NOTE — ED Notes (Addendum)
Removed tick in triage with sterile device  from suture removal kit.

## 2017-09-15 NOTE — ED Provider Notes (Signed)
[facility] EMERGENCY DEPARTMENT Provider Note   CSN: [id number] Arrival date & time: 09/15/17  1761     History   Chief Complaint Chief Complaint  Patient presents with  . Tick Removal  . Rash    HPI Brandy Smith is a 57 y.o. female.  HPI Patient presents to the emergency department with a rash to both lower extremities just above the ankles.  The patient states that she was working out of the yard on Tuesday and wore brand-new socks that had not been washed and states that she went back out into the yard on Wednesday in the sun and noticed a darker area around the ankle region.  She states that there is no itching or pain.  She states that it seems to follow the pattern of the sock.  Patient states that she spoke with someone at her church who told her she may have leukemia.  The patient was concerned about this and came to the emergency department for this.  Patient states that nothing seemed to make the condition better or worse.  She states she did not take any medications prior to arrival other than using some topical steroid cream.  Patient states that she did wear the socks on Thursday as well.  Patient denies any fever, nausea, vomiting, weakness, dizziness, bleeding, dysuria, chest pain, shortness of breath or syncope. Past Medical History:  Diagnosis Date  . Anxiety   . Diabetes ([place])   . Diaphragmatic hernia without mention of obstruction or gangrene   . Diverticulosis of colon (without mention of hemorrhage)   . GERD (gastroesophageal reflux disease)   . History of adenomatous polyp of colon   . Hyperlipidemia   . Hypertension   . Lichen sclerosus   . Stricture and stenosis of esophagus   . Unspecified essential hypertension   . Vitamin D deficiency     Patient Active Problem List   Diagnosis Date Noted  . Anxiety 09/24/2010  . Chest pain 09/24/2010  . HYPERTENSION 09/11/2009  . SCHATZKI'S RING 09/11/2009  . HIATAL HERNIA 09/11/2009  .  ESOPHAGEAL REFLUX 01/08/2008    Past Surgical History:  Procedure Laterality Date  . CESAREAN SECTION    . CESAREAN SECTION  1989  . COLONOSCOPY     08/10/2004. 09/29/2009  . POLYPECTOMY    . UPPER GI ENDOSCOPY       OB History    Gravida  3   Para  3   Term  1   Preterm  2   AB      Living  3     SAB      TAB      Ectopic      Multiple      Live Births               Home Medications    Prior to Admission medications   Medication Sig Start Date End Date Taking? Authorizing Provider  atorvastatin (LIPITOR) 10 MG tablet Take 10 mg by mouth daily. Unknown dosage    [provider]  clobetasol cream (TEMOVATE) 6.07 % Apply 1 application topically 2 (two) times a week. Thin vulvar application 3/71/06   Princess Bruins, MD  glipiZIDE (GLUCOTROL XL) 5 MG 24 hr tablet Take 5 mg by mouth daily with breakfast.    [provider]  lisinopril-hydrochlorothiazide (PRINZIDE,ZESTORETIC) 20-25 MG tablet Take 1 tablet by mouth daily.    [provider]    Family History Family  History  Problem Relation Age of Onset  . Colon cancer Paternal Grandmother 39  . Diabetes Paternal Grandmother   . Cancer Paternal Grandmother        colon  . Pancreatic cancer Paternal Grandfather   . Irritable bowel syndrome Mother   . Uterine cancer Mother   . Osteoporosis Mother   . Cancer Father        Appendiceal  . Colon cancer Father   . Rectal cancer Neg Hx   . Stomach cancer Neg Hx     Social History Social History   Tobacco Use  . Smoking status: Never Smoker  . Smokeless tobacco: Never Used  Substance Use Topics  . Alcohol use: No  . Drug use: No     Allergies   Patient has no known allergies.   Review of Systems Review of Systems All other systems negative except as documented in the HPI. All pertinent positives and negatives as reviewed in the HPI.  Physical Exam Updated Vital Signs BP 118/80 (BP Location: Right Arm)    Pulse 69   Temp 98.5 F (36.9 C) (Oral)   Resp 16   LMP 06/30/2008   SpO2 97%   Physical Exam  Constitutional: She is oriented to person, place, and time. She appears well-developed and well-nourished. No distress.  HENT:  Head: Normocephalic and atraumatic.  Eyes: Pupils are equal, round, and reactive to light.  Pulmonary/Chest: Effort normal.  Neurological: She is alert and oriented to person, place, and time.  Skin: Skin is warm and dry.     Psychiatric: She has a normal mood and affect.  Nursing note and vitals reviewed.    ED Treatments / Results  Labs (all labs ordered are listed, but only abnormal results are displayed) Labs Reviewed - No data to display  EKG None  Radiology No results found.  Procedures Procedures (including critical care time)  Medications Ordered in ED Medications - No data to display   Initial Impression / Assessment and Plan / ED Course  I have reviewed the triage vital signs and the nursing notes.  Pertinent labs & imaging results that were available during my care of the patient were reviewed by me and considered in my medical decision making (see chart for details).    Patient is advised that this is a contact dermatitis due to wearing the socks that had not been washed.  The area is very well demarcated in the height of where the sock was worn and in the pattern in the area of a sock.  I feel that this is a contact dermatitis that was then exacerbated by being in the sun following day.  I have advised the patient to follow-up with her primary doctor as needed.  Told return here as needed.  Patient has no other rash other than these area where the socks were warm  Final Clinical Impressions(s) / ED Diagnoses   Final diagnoses:  None    ED Discharge Orders    None       Dalia Heading, PA-C 09/15/17 1101    Malvin Johns, MD 09/15/17 1125

## 2017-09-15 NOTE — Discharge Instructions (Addendum)
Return here as needed.  Follow-up with your primary doctor as needed.

## 2017-09-27 ENCOUNTER — Other Ambulatory Visit: Payer: Self-pay | Admitting: Obstetrics & Gynecology

## 2017-11-20 ENCOUNTER — Encounter: Payer: BC Managed Care – PPO | Admitting: Obstetrics & Gynecology

## 2017-11-23 ENCOUNTER — Encounter: Payer: Self-pay | Admitting: Obstetrics & Gynecology

## 2017-11-23 ENCOUNTER — Ambulatory Visit (INDEPENDENT_AMBULATORY_CARE_PROVIDER_SITE_OTHER): Payer: BC Managed Care – PPO | Admitting: Obstetrics & Gynecology

## 2017-11-23 VITALS — BP 124/70 | Ht 64.75 in | Wt 175.0 lb

## 2017-11-23 DIAGNOSIS — R300 Dysuria: Secondary | ICD-10-CM | POA: Diagnosis not present

## 2017-11-23 DIAGNOSIS — Z01419 Encounter for gynecological examination (general) (routine) without abnormal findings: Secondary | ICD-10-CM

## 2017-11-23 DIAGNOSIS — Z13820 Encounter for screening for osteoporosis: Secondary | ICD-10-CM | POA: Diagnosis not present

## 2017-11-23 DIAGNOSIS — N904 Leukoplakia of vulva: Secondary | ICD-10-CM | POA: Diagnosis not present

## 2017-11-23 DIAGNOSIS — Z780 Asymptomatic menopausal state: Secondary | ICD-10-CM | POA: Diagnosis not present

## 2017-11-23 MED ORDER — CLOBETASOL PROPIONATE 0.05 % EX OINT: 1.0000 "application " | TOPICAL_OINTMENT | CUTANEOUS | 2 refills | Status: DC

## 2017-11-23 NOTE — Progress Notes (Signed)
Brandy Smith [birth year] [id number]   History:    57 y.o. G3P3L3 Married.  RP:  Established patient presenting for annual gyn exam   HPI: Menopause, well on no HRT.  No recurrence of PMB x evaluation in 07/2017.  Vulva much better, no itching anymore.  No pelvic pain.  Using a lubricant with intercourse with no pain.  Minimal stress urinary incontinence unchanged since last year.  Bowel movements normal.  Breasts normal.  DM type 2 under treatment.  Health labs with family physician.  Recent colonoscopy in 2019.  Past medical history,surgical history, family history and social history were all reviewed and documented in the EPIC chart.  Gynecologic History Patient's last menstrual period was 06/30/2008. Contraception: post menopausal status Last Pap: 08/2016. Results were: Negative, HPV HR neg Last mammogram: 11/2016. Results were: Negative Bone Density: 2013, will schedule now Colonoscopy: 2019  Obstetric History OB History  Gravida Para Term Preterm AB Living  3 3 1 2   3   SAB TAB Ectopic Multiple Live Births               # Outcome Date GA Lbr Len/2nd Weight Sex Delivery Anes PTL Lv  3 Term           2 Preterm           1 Preterm              ROS: A ROS was performed and pertinent positives and negatives are included in the history.  GENERAL: No fevers or chills. HEENT: No change in vision, no earache, sore throat or sinus congestion. NECK: No pain or stiffness. CARDIOVASCULAR: No chest pain or pressure. No palpitations. PULMONARY: No shortness of breath, cough or wheeze. GASTROINTESTINAL: No abdominal pain, nausea, vomiting or diarrhea, melena or bright red blood per rectum. GENITOURINARY: No urinary frequency, urgency, hesitancy or dysuria. MUSCULOSKELETAL: No joint or muscle pain, no back pain, no recent trauma. DERMATOLOGIC: No rash, no itching, no lesions. ENDOCRINE: No polyuria, polydipsia, no heat or cold intolerance. No recent change in weight. HEMATOLOGICAL: No anemia  or easy bruising or bleeding. NEUROLOGIC: No headache, seizures, numbness, tingling or weakness. PSYCHIATRIC: No depression, no loss of interest in normal activity or change in sleep pattern.     Exam:   BP 124/70   Ht 5' 4.75" (1.645 m)   Wt 175 lb (79.4 kg)   LMP 06/30/2008   BMI 29.35 kg/m   Body mass index is 29.35 kg/m.  General appearance : Well developed well nourished female. No acute distress HEENT: Eyes: no retinal hemorrhage or exudates,  Neck supple, trachea midline, no carotid bruits, no thyroidmegaly Lungs: Clear to auscultation, no rhonchi or wheezes, or rib retractions  Heart: Regular rate and rhythm, no murmurs or gallops Breast:Examined in sitting and supine position were symmetrical in appearance, no palpable masses or tenderness,  no skin retraction, no nipple inversion, no nipple discharge, no skin discoloration, no axillary or supraclavicular lymphadenopathy Abdomen: no palpable masses or tenderness, no rebound or guarding Extremities: no edema or skin discoloration or tenderness  Pelvic: Vulva: Normal, no whitening/erythema currently             Vagina: No gross lesions or discharge  Cervix: No gross lesions or discharge.  Pap reflex done  Uterus  AV, normal size, shape and consistency, non-tender and mobile  Adnexa  Without masses or tenderness  Anus: Normal  Assessment/Plan:  57 y.o. female for annual exam   1. Encounter for  routine gynecological examination with Papanicolaou smear of cervix Normal gynecologic exam and menopause.  Pap reflex done.  Breast exam normal.  Health labs with family physician.  Followed and treated for diabetes mellitus type 2.  Colonoscopy in 2019.  2. Menopause present Well on no hormone replacement therapy.  No further postmenopausal bleeding since negative investigation in March 2019.  3. Screening for osteoporosis Will schedule bone density here now.  Vitamin D supplements, calcium rich nutrition and regular  weightbearing physical activity recommended. - DG Bone Density; Future  4. Lichen sclerosus et atrophicus of the vulva Much improved on clobetasol ointment 0.05% twice weekly.  Given the completely normal appearance of her vulva on exam today, may decrease to once a week until next evaluation in a year.  5. Dysuria Mild dysuria.  U/A slightly disturbed, U. Culture pending.  Other orders - clobetasol ointment (TEMOVATE) 0.05 %; Apply 1 application topically once a week.  Counseling on above issues and coordination of care more than 50% for 10 minutes.  Princess Bruins MD, 2:44 PM 11/23/2017

## 2017-11-24 LAB — PAP IG W/ RFLX HPV ASCU

## 2017-11-25 LAB — URINALYSIS, COMPLETE W/RFL CULTURE
BILIRUBIN URINE: NEGATIVE
Hgb urine dipstick: NEGATIVE
Hyaline Cast: NONE SEEN /LPF
Ketones, ur: NEGATIVE
NITRITES URINE, INITIAL: NEGATIVE
PH: 5 (ref 5.0–8.0)
Protein, ur: NEGATIVE
RBC / HPF: NONE SEEN /HPF (ref 0–2)
SPECIFIC GRAVITY, URINE: 1.015 (ref 1.001–1.03)

## 2017-11-25 LAB — CULTURE INDICATED

## 2017-11-25 LAB — URINE CULTURE
MICRO NUMBER:: 90774194
SPECIMEN QUALITY: ADEQUATE

## 2017-11-26 ENCOUNTER — Encounter: Payer: Self-pay | Admitting: Obstetrics & Gynecology

## 2017-11-26 NOTE — Patient Instructions (Addendum)
1. Encounter for routine gynecological examination with Papanicolaou smear of cervix Normal gynecologic exam and menopause.  Pap reflex done.  Breast exam normal.  Health labs with family physician.  Followed and treated for diabetes mellitus type 2.  Colonoscopy in 2019.  2. Menopause present Well on no hormone replacement therapy.  No further postmenopausal bleeding since negative investigation in March 2019.  3. Screening for osteoporosis Will schedule bone density here now.  Vitamin D supplements, calcium rich nutrition and regular weightbearing physical activity recommended. - DG Bone Density; Future  4. Lichen sclerosus et atrophicus of the vulva Much improved on clobetasol ointment 0.05% twice weekly.  Given the completely normal appearance of her vulva on exam today, may decrease to once a week until next evaluation in a year.  5. Dysuria Mild dysuria.  U/A slightly disturbed, U. Culture pending.  Other orders - clobetasol ointment (TEMOVATE) 0.05 %; Apply 1 application topically once a week.   Brandy Smith, it was a pleasure seeing you today!  I will inform you of your results as soon as they are available.

## 2017-12-13 ENCOUNTER — Other Ambulatory Visit: Payer: Self-pay | Admitting: Gynecology

## 2017-12-13 DIAGNOSIS — E559 Vitamin D deficiency, unspecified: Secondary | ICD-10-CM | POA: Diagnosis not present

## 2017-12-13 DIAGNOSIS — Z13820 Encounter for screening for osteoporosis: Secondary | ICD-10-CM

## 2017-12-13 DIAGNOSIS — E7800 Pure hypercholesterolemia, unspecified: Secondary | ICD-10-CM | POA: Diagnosis not present

## 2017-12-13 DIAGNOSIS — E119 Type 2 diabetes mellitus without complications: Secondary | ICD-10-CM | POA: Diagnosis not present

## 2018-01-22 ENCOUNTER — Ambulatory Visit (INDEPENDENT_AMBULATORY_CARE_PROVIDER_SITE_OTHER): Payer: BC Managed Care – PPO

## 2018-01-22 DIAGNOSIS — Z13820 Encounter for screening for osteoporosis: Secondary | ICD-10-CM | POA: Diagnosis not present

## 2018-01-23 ENCOUNTER — Encounter: Payer: Self-pay | Admitting: Gynecology

## 2018-02-27 ENCOUNTER — Other Ambulatory Visit: Payer: Self-pay | Admitting: Obstetrics & Gynecology

## 2018-02-27 DIAGNOSIS — Z1231 Encounter for screening mammogram for malignant neoplasm of breast: Secondary | ICD-10-CM

## 2018-03-29 ENCOUNTER — Ambulatory Visit
Admission: RE | Admit: 2018-03-29 | Discharge: 2018-03-29 | Disposition: A | Discharge status: Home / Self Care | Payer: BC Managed Care – PPO | Source: Ambulatory Visit | Attending: Obstetrics & Gynecology | Admitting: Obstetrics & Gynecology

## 2018-03-29 DIAGNOSIS — Z1231 Encounter for screening mammogram for malignant neoplasm of breast: Secondary | ICD-10-CM

## 2018-04-13 DIAGNOSIS — E1165 Type 2 diabetes mellitus with hyperglycemia: Secondary | ICD-10-CM | POA: Diagnosis not present

## 2018-04-13 DIAGNOSIS — I10 Essential (primary) hypertension: Secondary | ICD-10-CM | POA: Diagnosis not present

## 2018-06-11 DIAGNOSIS — I10 Essential (primary) hypertension: Secondary | ICD-10-CM | POA: Diagnosis not present

## 2018-06-11 DIAGNOSIS — E1165 Type 2 diabetes mellitus with hyperglycemia: Secondary | ICD-10-CM | POA: Diagnosis not present

## 2018-06-11 DIAGNOSIS — E559 Vitamin D deficiency, unspecified: Secondary | ICD-10-CM | POA: Diagnosis not present

## 2018-06-11 DIAGNOSIS — E7800 Pure hypercholesterolemia, unspecified: Secondary | ICD-10-CM | POA: Diagnosis not present

## 2018-06-20 DIAGNOSIS — K219 Gastro-esophageal reflux disease without esophagitis: Secondary | ICD-10-CM | POA: Diagnosis not present

## 2018-06-20 DIAGNOSIS — E1165 Type 2 diabetes mellitus with hyperglycemia: Secondary | ICD-10-CM | POA: Diagnosis not present

## 2018-06-20 DIAGNOSIS — R197 Diarrhea, unspecified: Secondary | ICD-10-CM | POA: Diagnosis not present

## 2018-06-26 ENCOUNTER — Encounter: Payer: Self-pay | Admitting: Physician Assistant

## 2018-06-26 ENCOUNTER — Ambulatory Visit (INDEPENDENT_AMBULATORY_CARE_PROVIDER_SITE_OTHER): Payer: BC Managed Care – PPO | Admitting: Physician Assistant

## 2018-06-26 VITALS — BP 110/70 | HR 60 | Ht 66.0 in | Wt 170.0 lb

## 2018-06-26 DIAGNOSIS — R197 Diarrhea, unspecified: Secondary | ICD-10-CM | POA: Diagnosis not present

## 2018-06-26 DIAGNOSIS — R1013 Epigastric pain: Secondary | ICD-10-CM | POA: Diagnosis not present

## 2018-06-26 DIAGNOSIS — R142 Eructation: Secondary | ICD-10-CM | POA: Diagnosis not present

## 2018-06-26 MED ORDER — OMEPRAZOLE 40 MG PO CPDR: 40.0000 mg | DELAYED_RELEASE_CAPSULE | Freq: Two times a day (BID) | ORAL | 2 refills | Status: DC

## 2018-06-26 NOTE — Progress Notes (Addendum)
Chief Complaint: Eructations, epigastric pain, diarrhea  HPI:    Brandy Smith is a 58 year old Caucasian female with a past medical history as listed below, known to Dr. Hilarie Fredrickson, who presents clinic today for complaint of foul-smelling eructations, epigastric pain and diarrhea.      08/16/2017 colonoscopy for family history of colorectal cancer with two 4-5 mm polyps in the transverse colon, one 3 mm polyp in the sigmoid colon, mild diverticulosis in the sigmoid and descending colon otherwise normal.  Pathology showed tubular adenoma and hyperplastic polyp.  Repeat recommended in 5 years.    Today, presents to clinic and explains that on 06/13/2018 she was started on a new diabetic medication called Semaglutide (Rybelsus) and on the following Saturday, 06/16/2018 she started having foul-smelling eructations belching up a very "nasty smell that everyone could smell from the other side of my house", about every hour.  She then started with loose stools that were also very smelly.  This continued throughout the weekend with at least 8-10 stools per day and continued eructations.  This continued to the next week when she saw her PCP on 06/20/2018 and had some labs ordered including H. pylori antibody test which was negative per the patient and her started on Prilosec 40 mg daily.  She continued with loose stools and on 06/22/2018 had stool studies by her PCP which she has not yet received the results for.  Tells me that being on the Prilosec has helped a lot with the smell of symptoms but she has continued to belch and has continued with diarrhea, and in fact this morning again had sour smelling eructations.  Does tell me she had a [employer] sandwich and waffle fries for lunch yesterday.  Otherwise denies an unhealthy diet, spicy foods or NSAIDs.    Social history positive for some increased stress recently with her husband's health as well as her mother's.    Denies fever, chills, weight loss, anorexia, nausea,  vomiting, blood in her stool or symptoms that awaken her from sleep.  Past Medical History:  Diagnosis Date  . Anxiety   . Diabetes ([place])   . Diaphragmatic hernia without mention of obstruction or gangrene   . Diverticulosis of colon (without mention of hemorrhage)   . GERD (gastroesophageal reflux disease)   . History of adenomatous polyp of colon   . Hyperlipidemia   . Hypertension   . Lichen sclerosus   . Stricture and stenosis of esophagus   . Unspecified essential hypertension   . Vitamin D deficiency     Past Surgical History:  Procedure Laterality Date  . BREAST BIOPSY Left 2013  . CESAREAN SECTION    . CESAREAN SECTION  1989  . COLONOSCOPY     08/10/2004. 09/29/2009  . POLYPECTOMY    . UPPER GI ENDOSCOPY      Current Outpatient Medications  Medication Sig Dispense Refill  . atorvastatin (LIPITOR) 10 MG tablet Take 10 mg by mouth daily. Unknown dosage    . clobetasol ointment (TEMOVATE) 2.11 % Apply 1 application topically once a week. 30 g 2  . glipiZIDE (GLUCOTROL XL) 5 MG 24 hr tablet Take 5 mg by mouth daily with breakfast.    . lisinopril-hydrochlorothiazide (PRINZIDE,ZESTORETIC) 20-25 MG tablet Take 1 tablet by mouth daily.    . RYBELSUS 7 MG TABS Take 1 tablet by mouth daily.    . sertraline (ZOLOFT) 50 MG tablet Take 1 tablet by mouth daily.     Current Facility-Administered Medications  Medication Dose  Route Frequency Provider Last Rate Last Dose  . 0.9 %  sodium chloride infusion  500 mL Intravenous Once Pyrtle, Lajuan Lines, MD        Allergies as of 06/26/2018  . (No Known Allergies)    Family History  Problem Relation Age of Onset  . Colon cancer Paternal Grandmother 77  . Diabetes Paternal Grandmother   . Cancer Paternal Grandmother        colon  . Pancreatic cancer Paternal Grandfather   . Irritable bowel syndrome Mother   . Uterine cancer Mother   . Osteoporosis Mother   . Cancer Father        Appendiceal  . Colon cancer Father   . Rectal  cancer Neg Hx   . Stomach cancer Neg Hx     Social History   Socioeconomic History  . Marital status: Married    Spouse name: Not on file  . Number of children: 3  . Years of education: Not on file  . Highest education level: Not on file  Occupational History  . Occupation: Theme park manager: [employer]  . Financial resource strain: Not on file  . Food insecurity:    Worry: Not on file    Inability: Not on file  . Transportation needs:    Medical: Not on file    Non-medical: Not on file  Tobacco Use  . Smoking status: Never Smoker  . Smokeless tobacco: Never Used  Substance and Sexual Activity  . Alcohol use: No  . Drug use: No  . Sexual activity: Yes    Partners: Male    Comment: 1st intercourse- 51, partners- 49, married- 67 yrs   Lifestyle  . Physical activity:    Days per week: Not on file    Minutes per session: Not on file  . Stress: Not on file  Relationships  . Social connections:    Talks on phone: Not on file    Gets together: Not on file    Attends religious service: Not on file    Active member of club or organization: Not on file    Attends meetings of clubs or organizations: Not on file    Relationship status: Not on file  . Intimate partner violence:    Fear of current or ex partner: Not on file    Emotionally abused: Not on file    Physically abused: Not on file    Forced sexual activity: Not on file  Other Topics Concern  . Not on file  Social History Narrative   Occupation: Passenger transport manager   Married, 2 boys, 1 girl          Review of Systems:    Constitutional: No weight loss, fever or chills Cardiovascular: No chest pain  Respiratory: No SOB  Gastrointestinal: See HPI and otherwise negative   Physical Exam:  Vital signs: BP 110/70   Pulse 60   Ht 5\' 6"  (1.676 m)   Wt 170 lb (77.1 kg)   LMP 06/30/2008   BMI 27.44 kg/m   Constitutional:   Pleasant Caucasian female appears to be in NAD, Well developed, Well  nourished, alert and cooperative Respiratory: Respirations even and unlabored. Lungs clear to auscultation bilaterally.   No wheezes, crackles, or rhonchi.  Cardiovascular: Normal S1, S2. No MRG. Regular rate and rhythm. No peripheral edema, cyanosis or pallor.  Gastrointestinal:  Soft, nondistended, nontender. No rebound or guarding. Normal bowel sounds. No appreciable masses or hepatomegaly. Psychiatric:  Oriented to person, place and time. Demonstrates good judgement and reason without abnormal affect or behaviors.  Requesting recent labs.  Assessment: 1.  Epigastric pain: "Griping" per the patient, see below 2.  Eructations: Foul-smelling for 2 weeks with epigastric "griping" pain, some better on Omeprazole 40 mg daily; consider H. pylori versus gastritis versus PUD versus diabetic gastroparesis 3.  Diarrhea: For the past 2 weeks, seems to have started around the same time starting a new diabetic medication Semaglutide; consider relation to new medication versus H. pylori versus other  Plan: 1.  Patient is very worried in regards to her symptoms and is requesting an EGD.  Did discuss risk, benefits, limitations and alternatives and the patient agrees to proceed.  This is scheduled with Dr. Hilarie Fredrickson in the [facility] 2.  Increased patient's Omeprazole to 40 mg twice daily, 30-60 minutes before eating breakfast and dinner #60 with 3 refills 3.  Reviewed antireflux diet and lifestyle modifications.  Answered questions. 4.  We will request recent labs including stool studies and blood work from PCP 5.  Explained to patient that she can use over-the-counter Imodium as needed for diarrhea 6.  Patient to follow in clinic per recommendations from Dr. Hilarie Fredrickson after time of procedure.  Ellouise Newer, PA-C [facility] 06/26/2018, 8:40 AM  Cc: Deland Pretty, MD   Addendum 06/27/2026 10:36 AM  Received records. 06/22/2018 GI pathogen panel negative, CBC normal, CMP with a minimally decreased  potassium at 3.3 and otherwise normal, H. pylori antibody negative  No change to recommendations.  Ellouise Newer, PA-C  Addendum: Reviewed and agree with assessment and management plan. Pyrtle, Lajuan Lines, MD

## 2018-06-26 NOTE — Patient Instructions (Signed)
If you are age 59 or older, your body mass index should be between 23-30. Your Body mass index is 27.44 kg/m. If this is out of the aforementioned range listed, please consider follow up with your Primary Care Provider.  If you are age 22 or younger, your body mass index should be between 19-25. Your Body mass index is 27.44 kg/m. If this is out of the aformentioned range listed, please consider follow up with your Primary Care Provider.   You have been scheduled for an endoscopy. Please follow written instructions given to you at your visit today. If you use inhalers (even only as needed), please bring them with you on the day of your procedure. Your physician has requested that you go to www.startemmi.com and enter the access code given to you at your visit today. This web site gives a general overview about your procedure. However, you should still follow specific instructions given to you by our office regarding your preparation for the procedure.  We have sent the following medications to your pharmacy for you to pick up at your convenience: Omeprazole  We have requested labs from Dr. Shelia Media.  Thank you for choosing me and [facility].    Ellouise Newer, PA-C

## 2018-06-29 ENCOUNTER — Telehealth: Payer: Self-pay | Admitting: Physician Assistant

## 2018-06-29 NOTE — Telephone Encounter (Signed)
Pt states tht her pharm told her that the Prilosec was not called into the pharm

## 2018-06-29 NOTE — Telephone Encounter (Signed)
Spoke with patient to let her know that we received confirmation from pharmacy on 06/26/18.

## 2018-07-04 DIAGNOSIS — K219 Gastro-esophageal reflux disease without esophagitis: Secondary | ICD-10-CM | POA: Diagnosis not present

## 2018-07-04 DIAGNOSIS — R197 Diarrhea, unspecified: Secondary | ICD-10-CM | POA: Diagnosis not present

## 2018-07-12 ENCOUNTER — Encounter: Payer: Self-pay | Admitting: Internal Medicine

## 2018-07-12 ENCOUNTER — Ambulatory Visit (AMBULATORY_SURGERY_CENTER): Payer: BC Managed Care – PPO | Admitting: Internal Medicine

## 2018-07-12 VITALS — BP 119/80 | HR 53 | Temp 98.0°F | Resp 16 | Ht 66.0 in | Wt 170.0 lb

## 2018-07-12 DIAGNOSIS — R1013 Epigastric pain: Secondary | ICD-10-CM | POA: Diagnosis not present

## 2018-07-12 DIAGNOSIS — R12 Heartburn: Secondary | ICD-10-CM

## 2018-07-12 MED ORDER — SODIUM CHLORIDE 0.9 % IV SOLN: 500.0000 mL | Freq: Once | INTRAVENOUS | Status: DC

## 2018-07-12 NOTE — Progress Notes (Signed)
To PACU, VSS. Report to [email or web address] 

## 2018-07-12 NOTE — Op Note (Signed)
[facility] Patient Name: Brandy Smith Procedure Date: 07/12/2018 10:25 AM MRN: [id number] Endoscopist: Jerene Bears , MD Age: 58 Referring MD:  Date of Birth: 1960/10/30 Gender: Female Account #: [id number] Procedure:                Upper GI endoscopy Indications:              Epigastric abdominal pain, Indigestion Medicines:                Monitored Anesthesia Care Procedure:                Pre-Anesthesia Assessment:                           - Prior to the procedure, a History and Physical                            was performed, and patient medications and                            allergies were reviewed. The patient's tolerance of                            previous anesthesia was also reviewed. The risks                            and benefits of the procedure and the sedation                            options and risks were discussed with the patient.                            All questions were answered, and informed consent                            was obtained. Prior Anticoagulants: The patient has                            taken no previous anticoagulant or antiplatelet                            agents. ASA Grade Assessment: II - A patient with                            mild systemic disease. After reviewing the risks                            and benefits, the patient was deemed in                            satisfactory condition to undergo the procedure.                           After obtaining informed consent, the endoscope was  passed under direct vision. Throughout the                            procedure, the patient's blood pressure, pulse, and                            oxygen saturations were monitored continuously. The                            Endoscope was introduced through the mouth, and                            advanced to the second part of duodenum. The upper                            GI endoscopy was  accomplished without difficulty.                            The patient tolerated the procedure well. Scope In: Scope Out: Findings:                 The examined esophagus was normal.                           The entire examined stomach was normal.                           The examined duodenum was normal. Complications:            No immediate complications. Estimated Blood Loss:     Estimated blood loss: none. Impression:               - Normal esophagus.                           - Normal stomach.                           - Normal examined duodenum.                           - No specimens collected. Recommendation:           - Patient has a contact number available for                            emergencies. The signs and symptoms of potential                            delayed complications were discussed with the                            patient. Return to normal activities tomorrow.                            Written discharge instructions were provided to the  patient.                           - Resume previous diet.                           - Continue present medications. Can discontinue                            omeprazole at this time. Upper GI symptoms and                            diarrhea improved and have resolved after                            discontinuation of Rybelsus. Jerene Bears, MD 07/12/2018 10:41:09 AM This report has been signed electronically.

## 2018-07-12 NOTE — Patient Instructions (Signed)
Thank you for allowing [place] to care for you today!  Resume previous diet and medications today.  May discontinue Omeprazole.  Return to normal activities tomorrow.    YOU HAD AN ENDOSCOPIC PROCEDURE TODAY AT [facility]:   Refer to the procedure report that was given to you for any specific questions about what was found during the examination.  If the procedure report does not answer your questions, please call your gastroenterologist to clarify.  If you requested that your care partner not be given the details of your procedure findings, then the procedure report has been included in a sealed envelope for you to review at your convenience later.  YOU SHOULD EXPECT: Some feelings of bloating in the abdomen. Passage of more gas than usual.  Walking can help get rid of the air that was put into your GI tract during the procedure and reduce the bloating. If you had a lower endoscopy (such as a colonoscopy or flexible sigmoidoscopy) you may notice spotting of blood in your stool or on the toilet paper. If you underwent a bowel prep for your procedure, you may not have a normal bowel movement for a few days.  Please Note:  You might notice some irritation and congestion in your nose or some drainage.  This is from the oxygen used during your procedure.  There is no need for concern and it should clear up in a day or so.  SYMPTOMS TO REPORT IMMEDIATLY   Following upper endoscopy (EGD)  Vomiting of blood or coffee ground material  New chest pain or pain under the shoulder blades  Painful or persistently difficult swallowing  New shortness of breath  Fever of 100F or higher  Black, tarry-looking stools  For urgent or emergent issues, a gastroenterologist can be reached at any hour by calling [phone].   DIET:  We do recommend a small meal at first, but then you may proceed to your regular diet.  Drink plenty of fluids but you should avoid alcoholic beverages for 24  hours.  ACTIVITY:  You should plan to take it easy for the rest of today and you should NOT DRIVE or use heavy machinery until tomorrow (because of the sedation medicines used during the test).    FOLLOW UP: Our staff will call the number listed on your records the next business day following your procedure to check on you and address any questions or concerns that you may have regarding the information given to you following your procedure. If we do not reach you, we will leave a message.  However, if you are feeling well and you are not experiencing any problems, there is no need to return our call.  We will assume that you have returned to your regular daily activities without incident.  If any biopsies were taken you will be contacted by phone or by letter within the next 1-3 weeks.  Please call us at [phone] if you have not heard about the biopsies in 3 weeks.    SIGNATURES/CONFIDENTIALITY: You and/or your care partner have signed paperwork which will be entered into your electronic medical record.  These signatures attest to the fact that that the information above on your After Visit Summary has been reviewed and is understood.  Full responsibility of the confidentiality of this discharge information lies with you and/or your care-partner.

## 2018-07-13 ENCOUNTER — Telehealth: Payer: Self-pay | Admitting: *Deleted

## 2018-07-13 ENCOUNTER — Telehealth: Payer: Self-pay

## 2018-07-13 NOTE — Telephone Encounter (Signed)
  Follow up Call-  Call back number 07/12/2018 08/16/2017  Post procedure Call Back phone  # [phone]  Permission to leave phone message Yes Yes  Some recent data might be hidden     Patient questions:  Do you have a fever, pain , or abdominal swelling? No. Pain Score  0 *  Have you tolerated food without any problems? Yes.    Have you been able to return to your normal activities? Yes.    Do you have any questions about your discharge instructions: Diet   No. Medications  No. Follow up visit  No.  Do you have questions or concerns about your Care? No.  Actions: * If pain score is 4 or above: No action needed, pain <4.   No Problems noted per pt. maw

## 2018-07-13 NOTE — Telephone Encounter (Signed)
  Follow up Call-  Call back number 07/12/2018 08/16/2017  Post procedure Call Back phone  # [phone]  Permission to leave phone message Yes Yes  Some recent data might be hidden    [facility]

## 2018-10-26 DIAGNOSIS — L237 Allergic contact dermatitis due to plants, except food: Secondary | ICD-10-CM | POA: Diagnosis not present

## 2018-10-26 DIAGNOSIS — Z6826 Body mass index (BMI) 26.0-26.9, adult: Secondary | ICD-10-CM | POA: Diagnosis not present

## 2018-11-05 DIAGNOSIS — Z08 Encounter for follow-up examination after completed treatment for malignant neoplasm: Secondary | ICD-10-CM | POA: Diagnosis not present

## 2018-11-05 DIAGNOSIS — Z85828 Personal history of other malignant neoplasm of skin: Secondary | ICD-10-CM | POA: Diagnosis not present

## 2018-11-05 DIAGNOSIS — L7211 Pilar cyst: Secondary | ICD-10-CM | POA: Diagnosis not present

## 2018-11-05 DIAGNOSIS — L508 Other urticaria: Secondary | ICD-10-CM | POA: Diagnosis not present

## 2018-11-14 DIAGNOSIS — E7800 Pure hypercholesterolemia, unspecified: Secondary | ICD-10-CM | POA: Diagnosis not present

## 2018-11-14 DIAGNOSIS — I10 Essential (primary) hypertension: Secondary | ICD-10-CM | POA: Diagnosis not present

## 2018-11-14 DIAGNOSIS — E1165 Type 2 diabetes mellitus with hyperglycemia: Secondary | ICD-10-CM | POA: Diagnosis not present

## 2018-11-26 DIAGNOSIS — Z0000 Encounter for general adult medical examination without abnormal findings: Secondary | ICD-10-CM | POA: Diagnosis not present

## 2018-11-26 DIAGNOSIS — E119 Type 2 diabetes mellitus without complications: Secondary | ICD-10-CM | POA: Diagnosis not present

## 2018-11-26 DIAGNOSIS — E7800 Pure hypercholesterolemia, unspecified: Secondary | ICD-10-CM | POA: Diagnosis not present

## 2018-11-26 DIAGNOSIS — I10 Essential (primary) hypertension: Secondary | ICD-10-CM | POA: Diagnosis not present

## 2019-02-26 ENCOUNTER — Encounter: Payer: Self-pay | Admitting: Gynecology

## 2019-02-28 DIAGNOSIS — Z23 Encounter for immunization: Secondary | ICD-10-CM | POA: Diagnosis not present

## 2019-02-28 DIAGNOSIS — L739 Follicular disorder, unspecified: Secondary | ICD-10-CM | POA: Diagnosis not present

## 2019-03-21 DIAGNOSIS — L723 Sebaceous cyst: Secondary | ICD-10-CM | POA: Diagnosis not present

## 2019-03-26 ENCOUNTER — Other Ambulatory Visit: Payer: Self-pay | Admitting: Obstetrics & Gynecology

## 2019-03-26 DIAGNOSIS — Z1231 Encounter for screening mammogram for malignant neoplasm of breast: Secondary | ICD-10-CM

## 2019-04-05 DIAGNOSIS — L738 Other specified follicular disorders: Secondary | ICD-10-CM | POA: Diagnosis not present

## 2019-04-05 DIAGNOSIS — Z85828 Personal history of other malignant neoplasm of skin: Secondary | ICD-10-CM | POA: Diagnosis not present

## 2019-04-05 DIAGNOSIS — L7211 Pilar cyst: Secondary | ICD-10-CM | POA: Diagnosis not present

## 2019-04-05 DIAGNOSIS — L820 Inflamed seborrheic keratosis: Secondary | ICD-10-CM | POA: Diagnosis not present

## 2019-04-05 DIAGNOSIS — L570 Actinic keratosis: Secondary | ICD-10-CM | POA: Diagnosis not present

## 2019-04-05 DIAGNOSIS — L900 Lichen sclerosus et atrophicus: Secondary | ICD-10-CM | POA: Diagnosis not present

## 2019-05-15 ENCOUNTER — Other Ambulatory Visit: Payer: Self-pay

## 2019-05-15 ENCOUNTER — Ambulatory Visit
Admission: RE | Admit: 2019-05-15 | Discharge: 2019-05-15 | Disposition: A | Discharge status: Home / Self Care | Payer: BC Managed Care – PPO | Source: Ambulatory Visit | Attending: Obstetrics & Gynecology | Admitting: Obstetrics & Gynecology

## 2019-05-15 DIAGNOSIS — Z1231 Encounter for screening mammogram for malignant neoplasm of breast: Secondary | ICD-10-CM | POA: Diagnosis not present

## 2019-06-27 ENCOUNTER — Other Ambulatory Visit: Payer: Self-pay

## 2019-06-28 ENCOUNTER — Ambulatory Visit (INDEPENDENT_AMBULATORY_CARE_PROVIDER_SITE_OTHER): Payer: BC Managed Care – PPO | Admitting: Obstetrics & Gynecology

## 2019-06-28 ENCOUNTER — Encounter: Payer: Self-pay | Admitting: Obstetrics & Gynecology

## 2019-06-28 VITALS — BP 128/80 | Ht 66.0 in | Wt 184.0 lb

## 2019-06-28 DIAGNOSIS — N904 Leukoplakia of vulva: Secondary | ICD-10-CM

## 2019-06-28 DIAGNOSIS — B373 Candidiasis of vulva and vagina: Secondary | ICD-10-CM | POA: Diagnosis not present

## 2019-06-28 DIAGNOSIS — B3731 Acute candidiasis of vulva and vagina: Secondary | ICD-10-CM

## 2019-06-28 DIAGNOSIS — Z01419 Encounter for gynecological examination (general) (routine) without abnormal findings: Secondary | ICD-10-CM | POA: Diagnosis not present

## 2019-06-28 DIAGNOSIS — Z780 Asymptomatic menopausal state: Secondary | ICD-10-CM | POA: Diagnosis not present

## 2019-06-28 MED ORDER — CLOBETASOL PROPIONATE 0.05 % EX OINT: 1.0000 "application " | TOPICAL_OINTMENT | CUTANEOUS | 2 refills | Status: DC

## 2019-06-28 MED ORDER — FLUCONAZOLE 150 MG PO TABS: 150.0000 mg | ORAL_TABLET | Freq: Every day | ORAL | 2 refills | Status: AC

## 2019-06-28 NOTE — Patient Instructions (Signed)
1. Encounter for routine gynecological examination with Papanicolaou smear of cervix Normal gynecologic exam in menopause.  Pap reflex done.  Breast exam normal.  Screening mammogram December 2020 was negative.  Colonoscopy in 2019.  Health labs with family physician.  Body mass index 29.7.  Recommend a lower calorie/carb diet, diabetic diet.  Increase aerobic activities to 5 times a week and add light weightlifting every 2 days.    2. Postmenopause Well on no hormone replacement therapy.  No postmenopausal bleeding.  Bone density August 2019 was normal, will repeat at 5 years.  Vitamin D supplements, calcium intake of 1200 mg daily and regular weightbearing physical activity is recommended.  3. Yeast vaginitis Clinical yeast vaginitis.  Will treat with fluconazole 150 mg 1 tablet daily for 3 days.  Usage reviewed.  Hold Lipitor while taking fluconazole.  Prescription sent to pharmacy.  4. Lichen sclerosus et atrophicus of the vulva Using clobetasol ointment a thin layer on the vulva as needed.  Vulva looks normal on exam today.  We will continue as needed only for clobetasol treatment.  Other orders - clobetasol ointment (TEMOVATE) 0.05 %; Apply 1 application topically once a week. Apply a thin layer on the vulva once a week as needed. - fluconazole (DIFLUCAN) 150 MG tablet; Take 1 tablet (150 mg total) by mouth daily for 3 days. Hold Lipitor while taking Fluconazole.  Brandy Smith, it was a pleasure seeing you today!  I will inform you of your results as soon as they are available.

## 2019-06-28 NOTE — Addendum Note (Signed)
Addended by: Lorine Bears on: 06/28/2019 10:48 AM   Modules accepted: Orders

## 2019-06-28 NOTE — Progress Notes (Signed)
Brandy Smith Dec 25, 1960 [id number]   History:    59 y.o. G3P3L3 Married.  RP:  Established patient presenting for annual gyn exam   HPI: Menopause, well on no HRT.  No recurrence of PMB x evaluation in 07/2017.  Vulva much better, mild intermittent discomfort.  No pelvic pain.  Using a lubricant with intercourse with no pain.  Minimal stress urinary incontinence unchanged since last year.  Bowel movements normal.  Breasts normal.  DM type 2 under treatment.  Yeast vaginitis when sugar not well controled.  BMI 29.7. Health labs with family physician.  Colonoscopy in 2019.   Past medical history,surgical history, family history and social history were all reviewed and documented in the EPIC chart.  Gynecologic History Patient's last menstrual period was 06/30/2008.  Obstetric History OB History  Gravida Para Term Preterm AB Living  3 3 1 2   3   SAB TAB Ectopic Multiple Live Births               # Outcome Date GA Lbr Len/2nd Weight Sex Delivery Anes PTL Lv  3 Term           2 Preterm           1 Preterm              ROS: A ROS was performed and pertinent positives and negatives are included in the history.  GENERAL: No fevers or chills. HEENT: No change in vision, no earache, sore throat or sinus congestion. NECK: No pain or stiffness. CARDIOVASCULAR: No chest pain or pressure. No palpitations. PULMONARY: No shortness of breath, cough or wheeze. GASTROINTESTINAL: No abdominal pain, nausea, vomiting or diarrhea, melena or bright red blood per rectum. GENITOURINARY: No urinary frequency, urgency, hesitancy or dysuria. MUSCULOSKELETAL: No joint or muscle pain, no back pain, no recent trauma. DERMATOLOGIC: No rash, no itching, no lesions. ENDOCRINE: No polyuria, polydipsia, no heat or cold intolerance. No recent change in weight. HEMATOLOGICAL: No anemia or easy bruising or bleeding. NEUROLOGIC: No headache, seizures, numbness, tingling or weakness. PSYCHIATRIC: No depression, no loss  of interest in normal activity or change in sleep pattern.     Exam:   BP 128/80 (BP Location: Right Arm, Patient Position: Sitting, Cuff Size: Normal)   Ht 5\' 6"  (1.676 m)   Wt 184 lb (83.5 kg)   LMP 06/30/2008   BMI 29.70 kg/m   Body mass index is 29.7 kg/m.  General appearance : Well developed well nourished female. No acute distress HEENT: Eyes: no retinal hemorrhage or exudates,  Neck supple, trachea midline, no carotid bruits, no thyroidmegaly Lungs: Clear to auscultation, no rhonchi or wheezes, or rib retractions  Heart: Regular rate and rhythm, no murmurs or gallops Breast:Examined in sitting and supine position were symmetrical in appearance, no palpable masses or tenderness,  no skin retraction, no nipple inversion, no nipple discharge, no skin discoloration, no axillary or supraclavicular lymphadenopathy Abdomen: no palpable masses or tenderness, no rebound or guarding Extremities: no edema or skin discoloration or tenderness  Pelvic: Vulva: Normal             Vagina: No gross lesions or discharge  Cervix: No gross lesions or discharge.  Pap reflex done.  Uterus  AV, normal size, shape and consistency, non-tender and mobile  Adnexa  Without masses or tenderness  Anus: Normal   Assessment/Plan:  59 y.o. female for annual exam   1. Encounter for routine gynecological examination with Papanicolaou smear of cervix Normal  gynecologic exam in menopause.  Pap reflex done.  Breast exam normal.  Screening mammogram December 2020 was negative.  Colonoscopy in 2019.  Health labs with family physician.  Body mass index 29.7.  Recommend a lower calorie/carb diet, diabetic diet.  Increase aerobic activities to 5 times a week and add light weightlifting every 2 days.    2. Postmenopause Well on no hormone replacement therapy.  No postmenopausal bleeding.  Bone density August 2019 was normal, will repeat at 5 years.  Vitamin D supplements, calcium intake of 1200 mg daily and regular  weightbearing physical activity is recommended.  3. Yeast vaginitis Clinical yeast vaginitis.  Will treat with fluconazole 150 mg 1 tablet daily for 3 days.  Usage reviewed.  Hold Lipitor while taking fluconazole.  Prescription sent to pharmacy.  4. Lichen sclerosus et atrophicus of the vulva Using clobetasol ointment a thin layer on the vulva as needed.  Vulva looks normal on exam today.  We will continue as needed only for clobetasol treatment.  Other orders - clobetasol ointment (TEMOVATE) 0.05 %; Apply 1 application topically once a week. Apply a thin layer on the vulva once a week as needed. - fluconazole (DIFLUCAN) 150 MG tablet; Take 1 tablet (150 mg total) by mouth daily for 3 days. Hold Lipitor while taking Fluconazole.  Princess Bruins MD, 10:06 AM 06/28/2019

## 2019-07-02 DIAGNOSIS — E119 Type 2 diabetes mellitus without complications: Secondary | ICD-10-CM | POA: Diagnosis not present

## 2019-07-02 LAB — PAP IG W/ RFLX HPV ASCU

## 2019-07-09 DIAGNOSIS — E1165 Type 2 diabetes mellitus with hyperglycemia: Secondary | ICD-10-CM | POA: Diagnosis not present

## 2019-07-09 DIAGNOSIS — I10 Essential (primary) hypertension: Secondary | ICD-10-CM | POA: Diagnosis not present

## 2019-07-09 DIAGNOSIS — E7800 Pure hypercholesterolemia, unspecified: Secondary | ICD-10-CM | POA: Diagnosis not present

## 2019-07-22 ENCOUNTER — Other Ambulatory Visit: Payer: Self-pay

## 2019-07-22 ENCOUNTER — Ambulatory Visit: Payer: BC Managed Care – PPO | Attending: Internal Medicine

## 2019-07-22 DIAGNOSIS — Z20822 Contact with and (suspected) exposure to covid-19: Secondary | ICD-10-CM

## 2019-07-22 DIAGNOSIS — U071 COVID-19: Secondary | ICD-10-CM | POA: Insufficient documentation

## 2019-07-23 LAB — NOVEL CORONAVIRUS, NAA: SARS-CoV-2, NAA: DETECTED — AB

## 2019-07-24 ENCOUNTER — Telehealth: Payer: Self-pay | Admitting: Physician Assistant

## 2019-07-24 NOTE — Telephone Encounter (Signed)
Called to discuss with Brandy Smith about Covid symptoms and the use of bamlanivimab, a monoclonal antibody infusion for those with mild to moderate Covid symptoms and at a high risk of hospitalization.     Pt is qualified for this infusion at the [facility] infusion center due to co-morbid conditions and/or a member of an at-risk group, however declines infusion at this time. Symptoms tier reviewed as well as criteria for ending isolation.  Symptoms reviewed that would warrant ED/Hospital evaluation. Preventative practices reviewed. Patient verbalized understanding. Patient does not want any information about infusion of clinic.     Patient Active Problem List   Diagnosis Date Noted  . Anxiety 09/24/2010  . Chest pain 09/24/2010  . HYPERTENSION 09/11/2009  . SCHATZKI'S RING 09/11/2009  . HIATAL HERNIA 09/11/2009  . ESOPHAGEAL REFLUX 01/08/2008   Leanor Kail, PA - C

## 2019-11-26 DIAGNOSIS — E559 Vitamin D deficiency, unspecified: Secondary | ICD-10-CM | POA: Diagnosis not present

## 2020-01-28 DIAGNOSIS — I10 Essential (primary) hypertension: Secondary | ICD-10-CM | POA: Diagnosis not present

## 2020-01-28 DIAGNOSIS — E1165 Type 2 diabetes mellitus with hyperglycemia: Secondary | ICD-10-CM | POA: Diagnosis not present

## 2020-01-28 DIAGNOSIS — E7800 Pure hypercholesterolemia, unspecified: Secondary | ICD-10-CM | POA: Diagnosis not present

## 2020-02-27 IMAGING — MG DIGITAL SCREENING BILAT W/ TOMO W/ CAD
8 series · 8 of 24 positions shown · non-contrast
Comparison: Previous exam(s).

CLINICAL DATA: Screening.

EXAM:
DIGITAL SCREENING BILATERAL MAMMOGRAM WITH TOMO AND CAD

[L CC synth-2D]
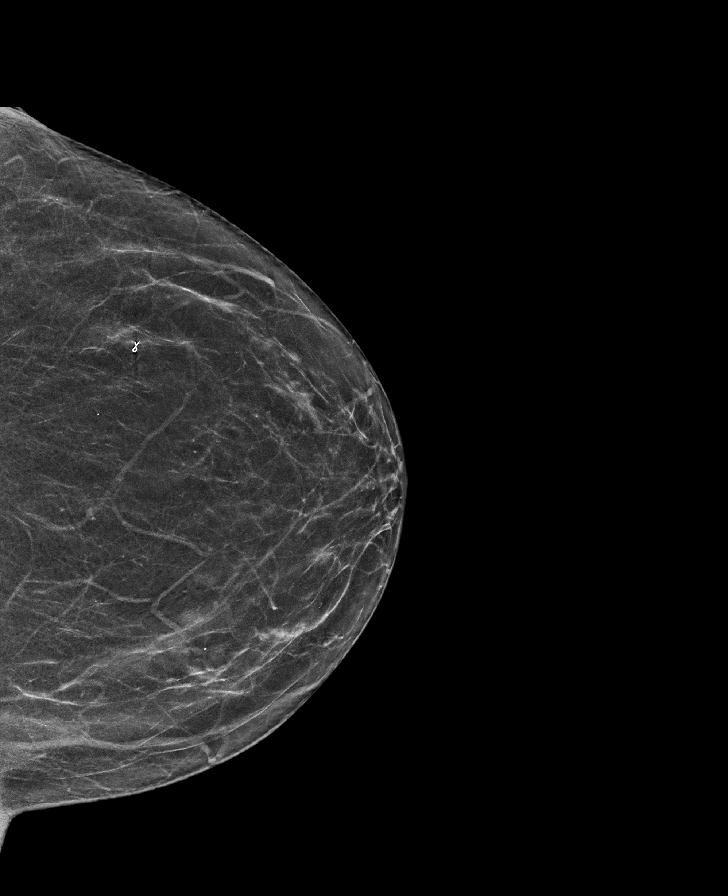

[R MLO synth-2D]
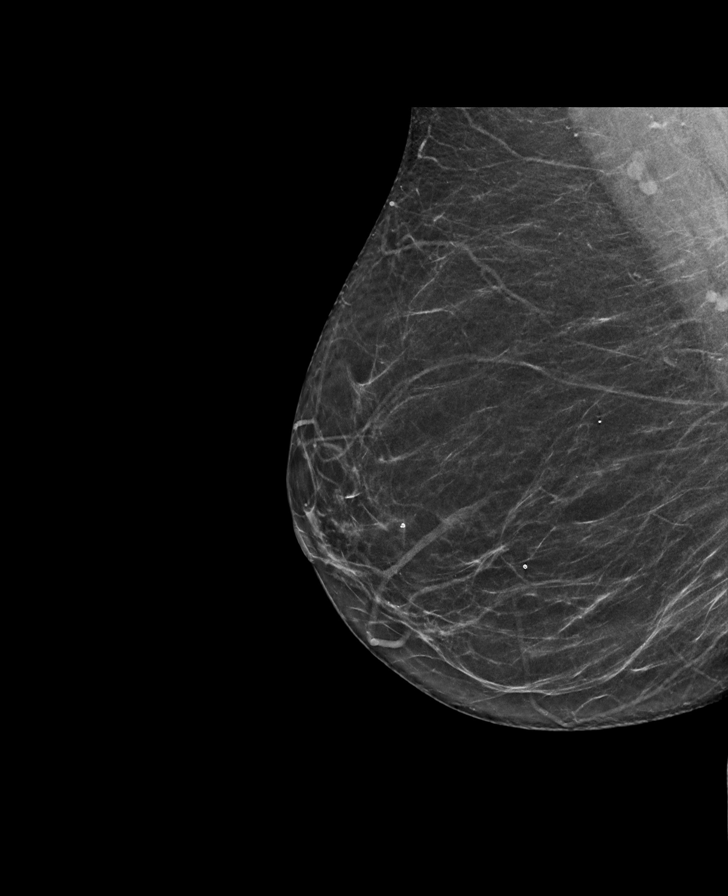

[R CC synth-2D]
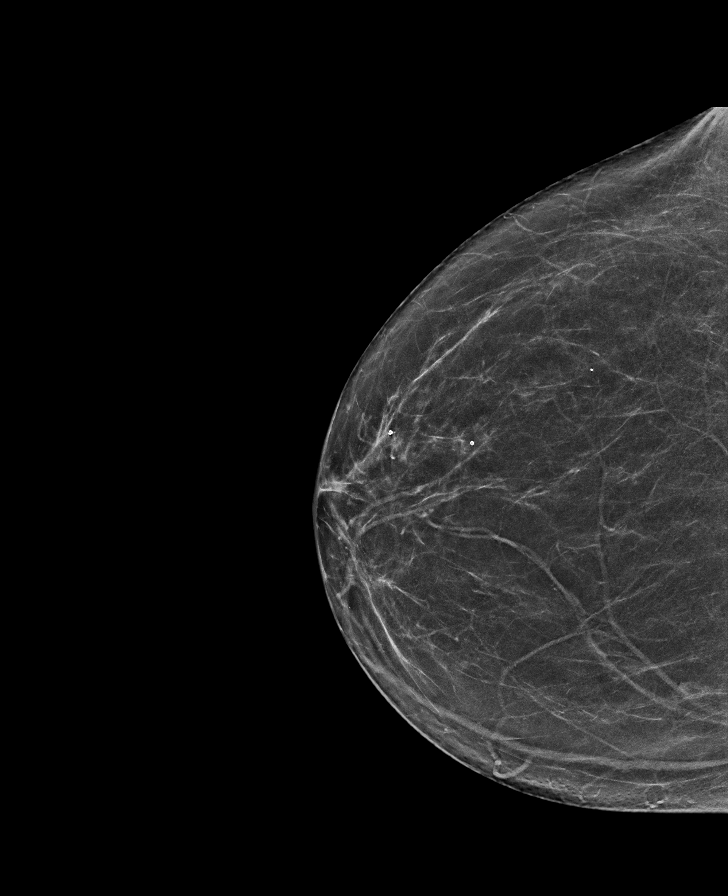

[L MLO synth-2D]
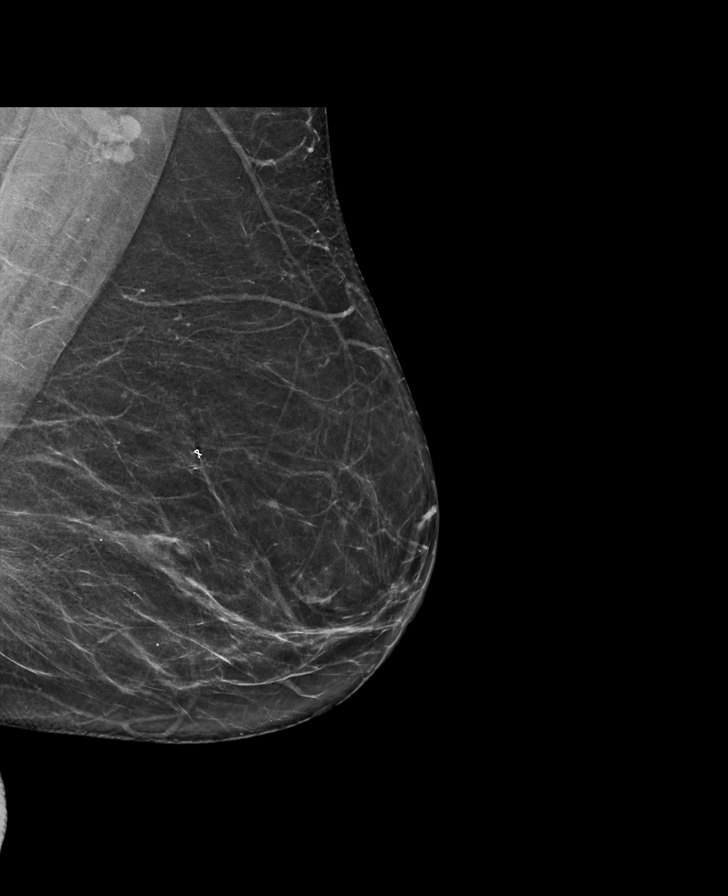

[L MLO tomo · tomo slice 37/73.0]
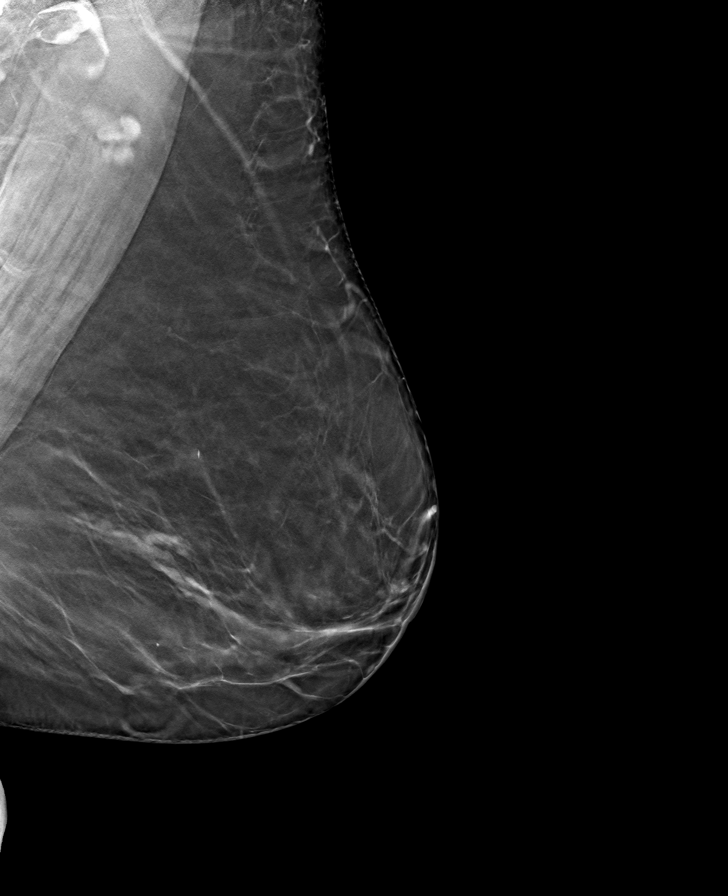

[R MLO tomo · tomo slice 35/69.0]
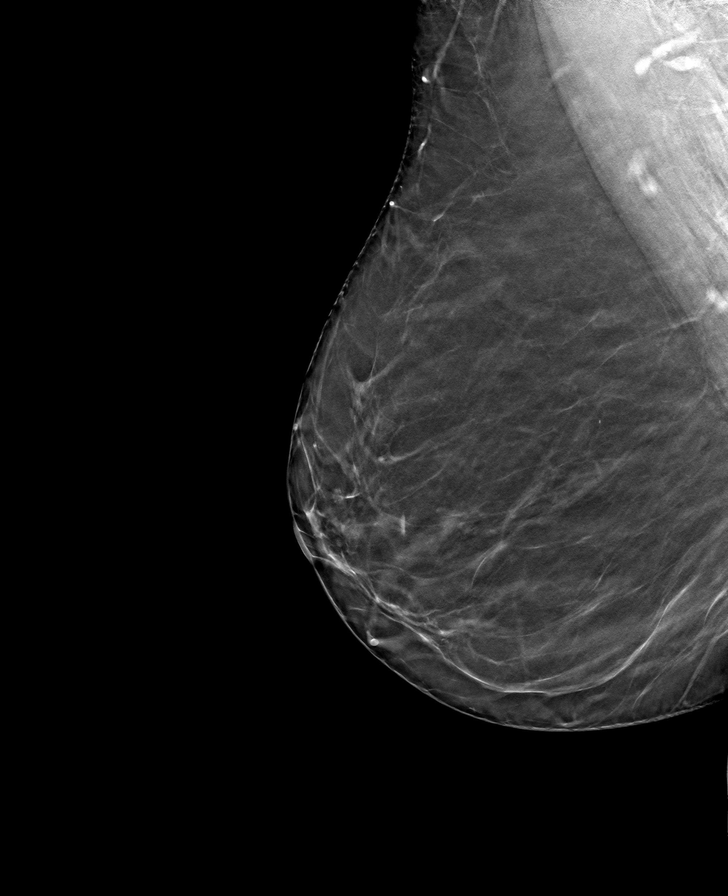

[R CC tomo · tomo slice 33/66.0]
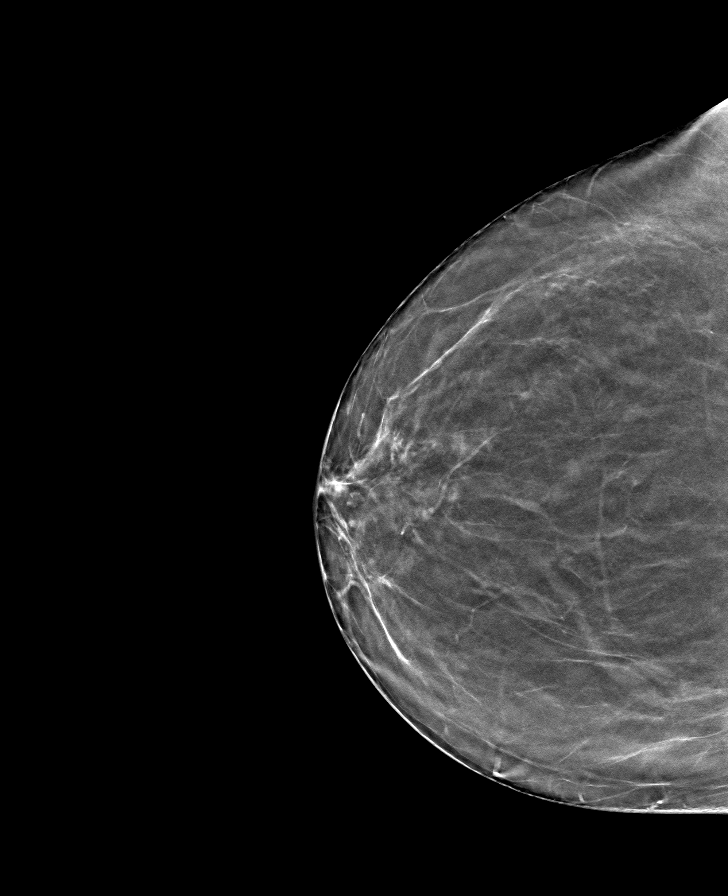

[L CC tomo · tomo slice 33/64.0]
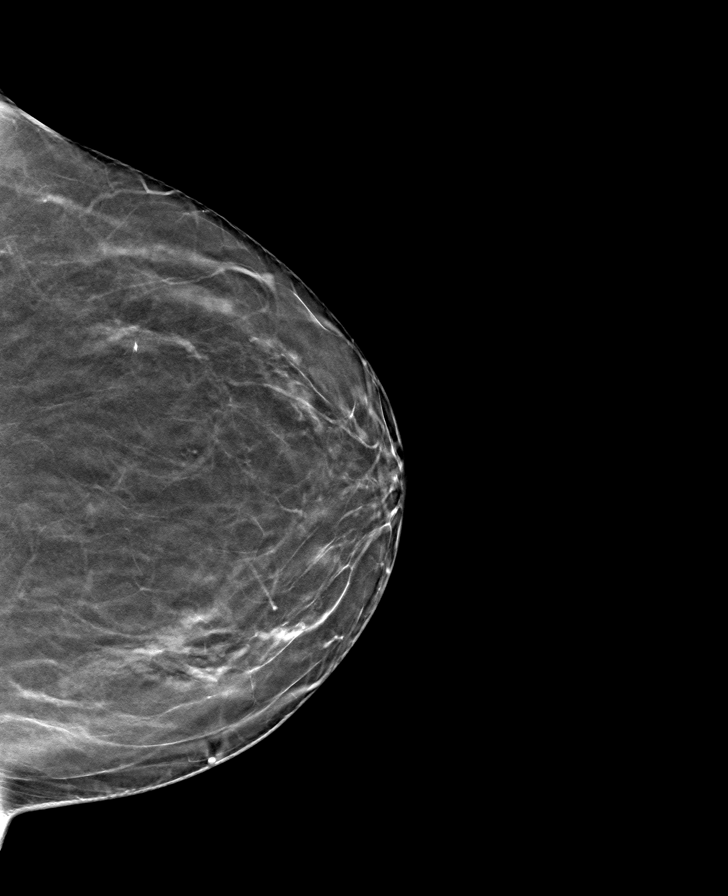

[8 of 24 positions shown; findings below may reference images not displayed]

ACR Breast Density Category b: There are scattered areas of
fibroglandular density.
FINDINGS: There are no findings suspicious for malignancy. Images were
processed with CAD.
IMPRESSION: No mammographic evidence of malignancy. A result letter of this
screening mammogram will be mailed directly to the patient.

RECOMMENDATION:
Screening mammogram in one year. (Code:[id number])

BI-RADS CATEGORY  1: Negative.

## 2020-05-19 DIAGNOSIS — E119 Type 2 diabetes mellitus without complications: Secondary | ICD-10-CM | POA: Diagnosis not present

## 2020-05-19 DIAGNOSIS — Z23 Encounter for immunization: Secondary | ICD-10-CM | POA: Diagnosis not present

## 2020-05-19 DIAGNOSIS — I10 Essential (primary) hypertension: Secondary | ICD-10-CM | POA: Diagnosis not present

## 2020-05-19 DIAGNOSIS — E559 Vitamin D deficiency, unspecified: Secondary | ICD-10-CM | POA: Diagnosis not present

## 2020-05-19 DIAGNOSIS — E7800 Pure hypercholesterolemia, unspecified: Secondary | ICD-10-CM | POA: Diagnosis not present

## 2020-07-09 ENCOUNTER — Ambulatory Visit (INDEPENDENT_AMBULATORY_CARE_PROVIDER_SITE_OTHER): Payer: BC Managed Care – PPO | Admitting: Obstetrics & Gynecology

## 2020-07-09 ENCOUNTER — Other Ambulatory Visit: Payer: Self-pay

## 2020-07-09 ENCOUNTER — Encounter: Payer: Self-pay | Admitting: Obstetrics & Gynecology

## 2020-07-09 VITALS — BP 120/80 | Ht 66.0 in | Wt 170.0 lb

## 2020-07-09 DIAGNOSIS — E663 Overweight: Secondary | ICD-10-CM | POA: Diagnosis not present

## 2020-07-09 DIAGNOSIS — Z780 Asymptomatic menopausal state: Secondary | ICD-10-CM

## 2020-07-09 DIAGNOSIS — Z01419 Encounter for gynecological examination (general) (routine) without abnormal findings: Secondary | ICD-10-CM

## 2020-07-09 DIAGNOSIS — N904 Leukoplakia of vulva: Secondary | ICD-10-CM

## 2020-07-09 MED ORDER — CLOBETASOL PROPIONATE 0.05 % EX OINT: 1.0000 "application " | TOPICAL_OINTMENT | CUTANEOUS | 2 refills | Status: AC

## 2020-07-09 NOTE — Addendum Note (Signed)
Addended by: Lorine Bears on: 07/09/2020 09:50 AM   Modules accepted: Orders

## 2020-07-09 NOTE — Progress Notes (Signed)
Brandy Smith 12-20-1960 [id number]   History:    60 y.o.  G3P3L3 Married.  BS:[id number] presenting for annual gyn exam   TSV:[id number], well on no HRT. No PMB.  No pelvic pain. Vulva much better, mild intermittent discomfort, rarely using Clobetasol.Using a lubricant with intercourse with no pain, recommend coconut oil. Minimal stress urinary incontinence unchanged since last year. Bowel movements normal. Breasts normal. DM type 2 under treatment. BMI improved to 27.44 on a strict low carb diet.  Health labs with family physician. Colonoscopy in 2019.   Past medical history,surgical history, family history and social history were all reviewed and documented in the EPIC chart.  Gynecologic History Patient's last menstrual period was 06/30/2008.  Obstetric History OB History  Gravida Para Term Preterm AB Living  3 3 1 2  0 3  SAB IAB Ectopic Multiple Live Births  0   0        # Outcome Date GA Lbr Len/2nd Weight Sex Delivery Anes PTL Lv  3 Term           2 Preterm           1 Preterm              ROS: A ROS was performed and pertinent positives and negatives are included in the history.  GENERAL: No fevers or chills. HEENT: No change in vision, no earache, sore throat or sinus congestion. NECK: No pain or stiffness. CARDIOVASCULAR: No chest pain or pressure. No palpitations. PULMONARY: No shortness of breath, cough or wheeze. GASTROINTESTINAL: No abdominal pain, nausea, vomiting or diarrhea, melena or bright red blood per rectum. GENITOURINARY: No urinary frequency, urgency, hesitancy or dysuria. MUSCULOSKELETAL: No joint or muscle pain, no back pain, no recent trauma. DERMATOLOGIC: No rash, no itching, no lesions. ENDOCRINE: No polyuria, polydipsia, no heat or cold intolerance. No recent change in weight. HEMATOLOGICAL: No anemia or easy bruising or bleeding. NEUROLOGIC: No headache, seizures, numbness, tingling or weakness. PSYCHIATRIC: No depression,  no loss of interest in normal activity or change in sleep pattern.     Exam:   BP 120/80 (BP Location: Right Arm, Patient Position: Sitting, Cuff Size: Normal)   Ht 5\' 6"  (1.676 m)   Wt 170 lb (77.1 kg)   LMP 06/30/2008   BMI 27.44 kg/m   Body mass index is 27.44 kg/m.  General appearance : Well developed well nourished female. No acute distress HEENT: Eyes: no retinal hemorrhage or exudates,  Neck supple, trachea midline, no carotid bruits, no thyroidmegaly Lungs: Clear to auscultation, no rhonchi or wheezes, or rib retractions  Heart: Regular rate and rhythm, no murmurs or gallops Breast:Examined in sitting and supine position were symmetrical in appearance, no palpable masses or tenderness,  no skin retraction, no nipple inversion, no nipple discharge, no skin discoloration, no axillary or supraclavicular lymphadenopathy Abdomen: no palpable masses or tenderness, no rebound or guarding Extremities: no edema or skin discoloration or tenderness  Pelvic: Vulva: Normal             Vagina: No gross lesions or discharge  Cervix: No gross lesions or discharge.  Pap reflex done.  Uterus  AV, normal size, shape and consistency, non-tender and mobile  Adnexa  Without masses or tenderness  Anus: Normal   Assessment/Plan:  59 y.o. female for annual exam   1. Encounter for routine gynecological examination with Papanicolaou smear of cervix Normal gynecologic exam.  Pap reflex done.  Breast exam normal.  Schedule screening Mammo.  Colono 2019.  Health labs with Fam MD.  2. Postmenopause Well on no HRT.  No PMB.  Will use Coconut oil for IC.  BD normal 12/2017.  Vit D supplements, Ca++ 1500 mg daily and regular weight bearing physical activities.  3. Lichen sclerosus et atrophicus of the vulva Much improved with weight loss and better sugar control. Will use Clobetasol only PRN.  4. Overweight (BMI 25.0-29.9) Much improved BMI on a strict low Carb diet.  Encouraged to continue and  improve fitness.  Other orders - clobetasol ointment (TEMOVATE) 0.05 %; Apply 1 application topically once a week. Apply a thin layer on the vulva once a week as needed.  Princess Bruins MD, 9:24 AM 07/09/2020

## 2020-07-10 LAB — PAP IG W/ RFLX HPV ASCU

## 2020-08-03 ENCOUNTER — Other Ambulatory Visit: Payer: Self-pay | Admitting: Obstetrics & Gynecology

## 2020-08-03 DIAGNOSIS — Z1231 Encounter for screening mammogram for malignant neoplasm of breast: Secondary | ICD-10-CM

## 2020-09-24 ENCOUNTER — Ambulatory Visit
Admission: RE | Admit: 2020-09-24 | Discharge: 2020-09-24 | Disposition: A | Discharge status: Home / Self Care | Payer: BC Managed Care – PPO | Source: Ambulatory Visit | Attending: Obstetrics & Gynecology | Admitting: Obstetrics & Gynecology

## 2020-09-24 ENCOUNTER — Other Ambulatory Visit: Payer: Self-pay

## 2020-09-24 ENCOUNTER — Ambulatory Visit: Admission: RE | Admit: 2020-09-24 | Payer: BC Managed Care – PPO | Source: Ambulatory Visit

## 2020-09-24 DIAGNOSIS — Z1231 Encounter for screening mammogram for malignant neoplasm of breast: Secondary | ICD-10-CM

## 2021-04-14 ENCOUNTER — Telehealth: Payer: Self-pay | Admitting: Internal Medicine

## 2021-04-14 NOTE — Telephone Encounter (Signed)
Left message for pt to call back  °

## 2021-04-14 NOTE — Telephone Encounter (Signed)
Patient called stating that she is having blood in stool and its very red. She requested a call back.

## 2021-04-15 DIAGNOSIS — D649 Anemia, unspecified: Secondary | ICD-10-CM | POA: Diagnosis not present

## 2021-04-15 DIAGNOSIS — Z800 Family history of malignant neoplasm of digestive organs: Secondary | ICD-10-CM | POA: Diagnosis not present

## 2021-04-15 DIAGNOSIS — K625 Hemorrhage of anus and rectum: Secondary | ICD-10-CM | POA: Diagnosis not present

## 2021-04-15 NOTE — Telephone Encounter (Signed)
Spoke with pt and she states she had a BM yesterday and saw BRB in the stool. She has scheduled an appt with her PCP for today. Pt also would like to see GI. Pt scheduled to see Nicoletta Ba PA 04/20/21 at 1:30pm. Pt aware of appt.

## 2021-04-20 ENCOUNTER — Other Ambulatory Visit (INDEPENDENT_AMBULATORY_CARE_PROVIDER_SITE_OTHER): Payer: BC Managed Care – PPO

## 2021-04-20 ENCOUNTER — Ambulatory Visit (INDEPENDENT_AMBULATORY_CARE_PROVIDER_SITE_OTHER): Payer: BC Managed Care – PPO | Admitting: Physician Assistant

## 2021-04-20 ENCOUNTER — Encounter: Payer: Self-pay | Admitting: Physician Assistant

## 2021-04-20 VITALS — BP 132/74 | HR 73 | Ht 66.0 in | Wt 177.4 lb

## 2021-04-20 DIAGNOSIS — K219 Gastro-esophageal reflux disease without esophagitis: Secondary | ICD-10-CM

## 2021-04-20 DIAGNOSIS — D649 Anemia, unspecified: Secondary | ICD-10-CM | POA: Diagnosis not present

## 2021-04-20 DIAGNOSIS — R1310 Dysphagia, unspecified: Secondary | ICD-10-CM | POA: Diagnosis not present

## 2021-04-20 DIAGNOSIS — K921 Melena: Secondary | ICD-10-CM

## 2021-04-20 DIAGNOSIS — Z800 Family history of malignant neoplasm of digestive organs: Secondary | ICD-10-CM

## 2021-04-20 LAB — IBC PANEL
Iron: 54 ug/dL (ref 42–145)
Saturation Ratios: 16.7 % — ABNORMAL LOW (ref 20.0–50.0)
TIBC: 323.4 ug/dL (ref 250.0–450.0)
Transferrin: 231 mg/dL (ref 212.0–360.0)

## 2021-04-20 LAB — FERRITIN: Ferritin: 244.2 ng/mL (ref 10.0–291.0)

## 2021-04-20 MED ORDER — PLENVU 140 G PO SOLR: 1.0000 | ORAL | 0 refills | Status: DC

## 2021-04-20 NOTE — Progress Notes (Signed)
Subjective:    Patient ID: Brandy Smith, female    DOB: [birth year], 60 y.o.   MRN: [id number]  HPI Brandy Smith is a pleasant 60 year old female, established with Dr. Hilarie Fredrickson.  She was last seen in February 2020 when she had EGD which was a normal exam.  She comes back in today after recent episode of bright red blood per rectum.  She also had been told with her most recent labs per her PCP Dr. Concha Pyo that she was mildly anemic with hemoglobin 11.2 hematocrit of 34/MCV 89 on 04/15/2021. Patient has history of hypertension, GERD, history of Schatzki's ring and adult onset diabetes mellitus.  She has family history of colon cancer.  Last colonoscopy was done March 2019 with removal of 2 sessile polyps from the transverse colon which were 4 to 5 mm in size and 1 3 mm polyp removed from the sigmoid colon also noted multiple diverticuli, no internal hemorrhoids.  Path on the polyps showed 2 of them to be tubular adenomas and 1 was hyperplastic.  He is indicated for 5-year interval follow-up.\   Patient says that her bowel movements tend to stay irregular and she will sometimes have periods of time that she will have several bowel movements per day with looser stools.  Last week she had an episode of bright red blood per rectum with a bowel movement noticing blood on the tissue and in the commode.  She does not think that there was blood mixed with the stool but is not sure.  She says her stool is darker on occasion but she has not been noticing any tarry or black bowel movements.  She is not had any recurrence of the episode of bleeding.  No anal rectal discomfort, no complaints of abdominal pain.  Again she does have intermittent urgency postprandially but no changes in her bowel pattern. She does have history of GERD and prior esophageal dilation for Schatzki's ring, she not currently taking any medications on a regular basis and does not have daily symptoms of heartburn or indigestion.  She does endorse  intermittent solid food dysphagia.  Review of Systems. Pertinent positive and negative review of systems were noted in the above HPI section.  All other review of systems was otherwise negative.   Outpatient Encounter Medications as of 04/20/2021  Medication Sig   Cholecalciferol (VITAMIN D3) 25 MCG (1000 UT) CAPS 2 capsules   clobetasol ointment (TEMOVATE) 6.81 % Apply 1 application topically once a week. Apply a thin layer on the vulva once a week as needed.   glipiZIDE (GLUCOTROL XL) 5 MG 24 hr tablet Take 5 mg by mouth daily with breakfast.   Lancets (ONETOUCH DELICA PLUS [id number]) MISC Apply topically daily.   lisinopril-hydrochlorothiazide (PRINZIDE,ZESTORETIC) 20-25 MG tablet Take 1 tablet by mouth daily.   metFORMIN (GLUCOPHAGE-XR) 500 MG 24 hr tablet TAKE UP TO 2 TABLETS TWICE A DAY   ONETOUCH VERIO test strip CHECK BLOOD SUGAR TWICE A DAY   PEG-KCl-NaCl-NaSulf-Na Asc-C (PLENVU) 140 g SOLR Take 1 kit by mouth as directed.   sertraline (ZOLOFT) 50 MG tablet Take 1 tablet by mouth daily.   [DISCONTINUED] atorvastatin (LIPITOR) 10 MG tablet Take 10 mg by mouth daily. Unknown dosage   No facility-administered encounter medications on file as of 04/20/2021.   No Known Allergies Patient Active Problem List   Diagnosis Date Noted   Anxiety 09/24/2010   Chest pain 09/24/2010   HYPERTENSION 09/11/2009   SCHATZKI'S RING 09/11/2009   HIATAL HERNIA 09/11/2009  ESOPHAGEAL REFLUX 01/08/2008   Social History   Socioeconomic History   Marital status: Married    Spouse name: Not on file   Number of children: 3   Years of education: Not on file   Highest education level: Not on file  Occupational History   Occupation: Theme park manager: [employer]  Tobacco Use   Smoking status: Never   Smokeless tobacco: Never  Vaping Use   Vaping Use: Never used  Substance and Sexual Activity   Alcohol use: No   Drug use: No   Sexual activity: Yes    Partners: Male    Comment: 1st  intercourse- 12, partners- 60, married- 42 yrs   Other Topics Concern   Not on file  Social History Narrative   Occupation: Passenger transport manager   Married, 2 boys, 1 girl         Social Determinants of Radio broadcast assistant Strain: Not on file  Food Insecurity: Not on file  Transportation Needs: Not on file  Physical Activity: Not on file  Stress: Not on file  Social Connections: Not on file  Intimate Partner Violence: Not on file    Brandy Smith's family history includes Cancer in her father and paternal grandmother; Colon cancer in her father; Colon cancer (age of onset: 49) in her paternal grandmother; Diabetes in her paternal grandmother; Irritable bowel syndrome in her mother; Osteoporosis in her mother; Pancreatic cancer in her paternal grandfather; Uterine cancer in her mother.      Objective:    Vitals:   04/20/21 1321  BP: 132/74  Pulse: 73    Physical Exam Well-developed well-nourished older WF  in no acute distress.  Height, [id number]  BMI 28.6  HEENT; nontraumatic normocephalic, EOMI, PE R LA, sclera anicteric. Oropharynx;not examined  Neck; supple, no JVD Cardiovascular; regular rate and rhythm with S1-S2, no murmur rub or gallop Pulmonary; Clear bilaterally Abdomen; soft, nontender, nondistended, no palpable mass or hepatosplenomegaly, bowel sounds are active Rectal;not done Skin; benign exam, no jaundice rash or appreciable lesions Extremities; no clubbing cyanosis or edema skin warm and dry Neuro/Psych; alert and oriented x4, grossly nonfocal mood and affect appropriate        Assessment & Plan:   Number one 60 year old white female with with new normocytic anemia, rule out iron deficiency, rule out occult GI blood loss #2 recent episode of hematochezia-etiology not clear, no internal hemorrhoids noted at the time of last colonoscopy. #3 Positive family history of colon cancer #4 .  Personal history of adenomatous colon polyps-last colonoscopy March  2019 #5 intermittent GERD, no chronic PPI, vague intermittent dysphagia, rule out early stricture, remote history of EGD with esophageal dilation/2009  #6 adult onset diabetes mellitus #7.  Hypertension  Plan; check iron studies today Patient will be scheduled for colonoscopy and EGD with Dr. Hilarie Fredrickson.  Both procedures were discussed in detail with the patient including indications risks and benefits and she is agreeable to proceed. Further recommendations pending findings at endoscopic evaluation.  Dodi Leu Genia Harold PA-C 04/20/2021   Cc: Deland Pretty, MD

## 2021-04-20 NOTE — Progress Notes (Signed)
Addendum: Reviewed and agree with assessment and management plan. Keonta Alsip M, MD  

## 2021-04-20 NOTE — Patient Instructions (Signed)
If you are age 61 or younger, your body mass index should be between 19-25. Your Body mass index is 28.63 kg/m. If this is out of the aformentioned range listed, please consider follow up with your Primary Care Provider.  ________________________________________________________  The [place] [facility] providers would like to encourage you to use [facility] to communicate with providers for non-urgent requests or questions.  Due to long hold times on the telephone, sending your provider a message by [facility] may be a faster and more efficient way to get a response.  Please allow 48 business hours for a response.  Please remember that this is for non-urgent requests.  _______________________________________________________  Your provider has requested that you go to the basement level for lab work before leaving today. Press "B" on the elevator. The lab is located at the first door on the left as you exit the elevator.  You have been scheduled for an endoscopy and colonoscopy. Please follow the written instructions given to you at your visit today. Please pick up your prep supplies at the pharmacy within the next 1-3 days. If you use inhalers (even only as needed), please bring them with you on the day of your procedure.  Follow up pending the results of your Endoscopy/Colonoscopy.  Thank you for entrusting me with your care and choosing [facility].  Amy Esterwood, PA-C

## 2021-05-17 ENCOUNTER — Other Ambulatory Visit: Payer: Self-pay

## 2021-05-17 ENCOUNTER — Ambulatory Visit (AMBULATORY_SURGERY_CENTER): Payer: BC Managed Care – PPO | Admitting: Internal Medicine

## 2021-05-17 ENCOUNTER — Encounter: Payer: Self-pay | Admitting: Internal Medicine

## 2021-05-17 VITALS — BP 120/61 | HR 65 | Temp 99.3°F | Resp 20 | Ht 66.0 in | Wt 177.0 lb

## 2021-05-17 DIAGNOSIS — K219 Gastro-esophageal reflux disease without esophagitis: Secondary | ICD-10-CM

## 2021-05-17 DIAGNOSIS — Z800 Family history of malignant neoplasm of digestive organs: Secondary | ICD-10-CM | POA: Diagnosis not present

## 2021-05-17 DIAGNOSIS — K921 Melena: Secondary | ICD-10-CM

## 2021-05-17 DIAGNOSIS — R1310 Dysphagia, unspecified: Secondary | ICD-10-CM | POA: Diagnosis not present

## 2021-05-17 DIAGNOSIS — K5730 Diverticulosis of large intestine without perforation or abscess without bleeding: Secondary | ICD-10-CM | POA: Diagnosis not present

## 2021-05-17 DIAGNOSIS — K2980 Duodenitis without bleeding: Secondary | ICD-10-CM | POA: Diagnosis not present

## 2021-05-17 DIAGNOSIS — K319 Disease of stomach and duodenum, unspecified: Secondary | ICD-10-CM | POA: Diagnosis not present

## 2021-05-17 DIAGNOSIS — D120 Benign neoplasm of cecum: Secondary | ICD-10-CM

## 2021-05-17 DIAGNOSIS — D649 Anemia, unspecified: Secondary | ICD-10-CM

## 2021-05-17 DIAGNOSIS — D122 Benign neoplasm of ascending colon: Secondary | ICD-10-CM | POA: Diagnosis not present

## 2021-05-17 MED ORDER — SODIUM CHLORIDE 0.9 % IV SOLN: 500.0000 mL | INTRAVENOUS | Status: DC

## 2021-05-17 NOTE — Progress Notes (Signed)
Pt's states no medical or surgical changes since previsit or office visit. 

## 2021-05-17 NOTE — Op Note (Signed)
[place] Patient Name: Brandy Smith Procedure Date: 05/17/2021 12:28 PM MRN: [id number] Endoscopist: Jerene Bears , MD Age: 60 Referring MD:  Date of Birth: [birth year] Gender: Female Account #: [id number] Procedure:                Upper GI endoscopy Indications:              Intermittent dysphagia, Gastro-esophageal reflux                            disease, normal EGD in 2020 Medicines:                Monitored Anesthesia Care Procedure:                Pre-Anesthesia Assessment:                           - Prior to the procedure, a History and Physical                            was performed, and patient medications and                            allergies were reviewed. The patient's tolerance of                            previous anesthesia was also reviewed. The risks                            and benefits of the procedure and the sedation                            options and risks were discussed with the patient.                            All questions were answered, and informed consent                            was obtained. Prior Anticoagulants: The patient has                            taken no previous anticoagulant or antiplatelet                            agents. ASA Grade Assessment: II - A patient with                            mild systemic disease. After reviewing the risks                            and benefits, the patient was deemed in                            satisfactory condition to undergo the procedure.  After obtaining informed consent, the endoscope was                            passed under direct vision. Throughout the                            procedure, the patient's blood pressure, pulse, and                            oxygen saturations were monitored continuously. The                            GIF [id number] #[id number] was introduced through the                            mouth, and advanced to the second  part of duodenum.                            The upper GI endoscopy was accomplished without                            difficulty. The patient tolerated the procedure                            well. Scope In: Scope Out: Findings:                 Normal esophageal mucosa. No endoscopic abnormality                            was evident in the esophagus to explain the                            patient's complaint of dysphagia. It was decided,                            however, to proceed with dilation of the entire                            esophagus. The scope was withdrawn. Dilation was                            performed with a Maloney dilator with mild                            resistance at 52 Fr.                           The entire examined stomach was normal. Biopsies                            were taken with a cold forceps for histology and                            Helicobacter  pylori testing.                           Patchy mildly erythematous mucosa was found in the                            duodenal bulb.                           The second portion of the duodenum was normal. Complications:            No immediate complications. Estimated Blood Loss:     Estimated blood loss was minimal. Impression:               - No endoscopic esophageal abnormality to explain                            patient's dysphagia. Esophagus dilated with 52 Fr.                            Maloney.                           - Normal stomach. Biopsied given mild duodenal                            inflammation to exclude H. Pylori infection.                           - Erythematous duodenal bulb.                           - Normal second portion of the duodenum. Recommendation:           - Patient has a contact number available for                            emergencies. The signs and symptoms of potential                            delayed complications were discussed with the                             patient. Return to normal activities tomorrow.                            Written discharge instructions were provided to the                            patient.                           - Post-dilation diet and then resume previous diet                            if tolerated.                           -  Continue present medications.                           - Await pathology results.                           - See the other procedure note for documentation of                            additional recommendations. Jerene Bears, MD 05/17/2021 2:11:40 PM This report has been signed electronically.

## 2021-05-17 NOTE — Op Note (Signed)
[place] Patient Name: Brandy Smith Procedure Date: 05/17/2021 12:26 PM MRN: [id number] Endoscopist: Jerene Bears , MD Age: 60 Referring MD:  Date of Birth: 1960-08-23 Gender: Female Account #: [id number] Procedure:                Colonoscopy Indications:              Hematochezia (isolated), personal history of                            adenomatous colon polyps (2 less than 1 cm removed                            at last exam March 2019), anemia with normal iron                            studies, family history of colon cancer Medicines:                Monitored Anesthesia Care Procedure:                Pre-Anesthesia Assessment:                           - Prior to the procedure, a History and Physical                            was performed, and patient medications and                            allergies were reviewed. The patient's tolerance of                            previous anesthesia was also reviewed. The risks                            and benefits of the procedure and the sedation                            options and risks were discussed with the patient.                            All questions were answered, and informed consent                            was obtained. Prior Anticoagulants: The patient has                            taken no previous anticoagulant or antiplatelet                            agents. ASA Grade Assessment: II - A patient with                            mild systemic disease. After reviewing the risks  and benefits, the patient was deemed in                            satisfactory condition to undergo the procedure.                           After obtaining informed consent, the colonoscope                            was passed under direct vision. Throughout the                            procedure, the patient's blood pressure, pulse, and                            oxygen saturations were  monitored continuously. The                            PCF-HQ190L Colonoscope was introduced through the                            anus and advanced to the cecum, identified by                            appendiceal orifice and ileocecal valve. The                            colonoscopy was performed without difficulty. The                            patient tolerated the procedure well. The quality                            of the bowel preparation was excellent. The                            ileocecal valve, appendiceal orifice, and rectum                            were photographed. Scope In: 1:51:04 PM Scope Out: 2:06:17 PM Scope Withdrawal Time: 0 hours 11 minutes 33 seconds  Total Procedure Duration: 0 hours 15 minutes 13 seconds  Findings:                 The digital rectal exam was normal.                           Two sessile polyps were found in the ascending                            colon and cecum. The polyps were 3 to 5 mm in size.                            These polyps were removed with a cold snare.  Resection and retrieval were complete.                           Multiple small-mouthed diverticula were found in                            the sigmoid colon, descending colon, transverse                            colon and ascending colon.                           The retroflexed view of the distal rectum and anal                            verge was normal and showed no anal or rectal                            abnormalities. Complications:            No immediate complications. Estimated Blood Loss:     Estimated blood loss was minimal. Impression:               - Two 3 to 5 mm polyps in the ascending colon and                            in the cecum, removed with a cold snare. Resected                            and retrieved.                           - Diverticulosis in the sigmoid colon, in the                            descending  colon, in the transverse colon and in                            the ascending colon. Possible cause of isolated and                            not-recurrent hematochezia.                           - The distal rectum and anal verge are normal on                            retroflexion view. Recommendation:           - Patient has a contact number available for                            emergencies. The signs and symptoms of potential                            delayed complications  were discussed with the                            patient. Return to normal activities tomorrow.                            Written discharge instructions were provided to the                            patient.                           - Resume previous diet.                           - Continue present medications.                           - Await pathology results.                           - Repeat colonoscopy is recommended for                            surveillance in 5 years (to replace current recall                            for colonoscopy). The colonoscopy date will be                            determined after pathology results from today's                            exam become available for review. Jerene Bears, MD 05/17/2021 2:15:26 PM This report has been signed electronically.

## 2021-05-17 NOTE — Progress Notes (Signed)
Patient presents today for EGD and colonoscopy  Seen in the office on 04/20/2021 by Nicoletta Ba, PA-C.  Please see that note for details.  No significant change in medical history since that office visit  Patient remains appropriate for upper and lower endoscopy in the outpatient setting today.

## 2021-05-17 NOTE — Progress Notes (Signed)
Called to room to assist during endoscopic procedure.  Patient ID and intended procedure confirmed with present staff. Received instructions for my participation in the procedure from the performing physician.  

## 2021-05-17 NOTE — Progress Notes (Signed)
Report to PACU, RN, vss, BBS= Clear.  

## 2021-05-17 NOTE — Patient Instructions (Signed)
Please read handouts provided. Continue present medications. Await pathology results. Dilation Diet.    YOU HAD AN ENDOSCOPIC PROCEDURE TODAY AT [facility]:   Refer to the procedure report that was given to you for any specific questions about what was found during the examination.  If the procedure report does not answer your questions, please call your gastroenterologist to clarify.  If you requested that your care partner not be given the details of your procedure findings, then the procedure report has been included in a sealed envelope for you to review at your convenience later.  YOU SHOULD EXPECT: Some feelings of bloating in the abdomen. Passage of more gas than usual.  Walking can help get rid of the air that was put into your GI tract during the procedure and reduce the bloating. If you had a lower endoscopy (such as a colonoscopy or flexible sigmoidoscopy) you may notice spotting of blood in your stool or on the toilet paper. If you underwent a bowel prep for your procedure, you may not have a normal bowel movement for a few days.  Please Note:  You might notice some irritation and congestion in your nose or some drainage.  This is from the oxygen used during your procedure.  There is no need for concern and it should clear up in a day or so.  SYMPTOMS TO REPORT IMMEDIATELY:  Following lower endoscopy (colonoscopy or flexible sigmoidoscopy):  Excessive amounts of blood in the stool  Significant tenderness or worsening of abdominal pains  Swelling of the abdomen that is new, acute  Fever of 100F or higher  Following upper endoscopy (EGD)  Vomiting of blood or coffee ground material  New chest pain or pain under the shoulder blades  Painful or persistently difficult swallowing  New shortness of breath  Fever of 100F or higher  Black, tarry-looking stools  For urgent or emergent issues, a gastroenterologist can be reached at any hour by calling [phone]  [phone]. Do not use MyChart messaging for urgent concerns.    DIET:  Drink plenty of fluids but you should avoid alcoholic beverages for 24 hours.  ACTIVITY:  You should plan to take it easy for the rest of today and you should NOT DRIVE or use heavy machinery until tomorrow (because of the sedation medicines used during the test).    FOLLOW UP: Our staff will call the number listed on your records 48-72 hours following your procedure to check on you and address any questions or concerns that you may have regarding the information given to you following your procedure. If we do not reach you, we will leave a message.  We will attempt to reach you two times.  During this call, we will ask if you have developed any symptoms of COVID 19. If you develop any symptoms (ie: fever, flu-like symptoms, shortness of breath, cough etc.) before then, please call [phone].  If you test positive for Covid 19 in the 2 weeks post procedure, please call and report this information to [place].    If any biopsies were taken you will be contacted by phone or by letter within the next 1-3 weeks.  Please call us at [phone] if you have not heard about the biopsies in 3 weeks.    SIGNATURES/CONFIDENTIALITY: You and/or your care partner have signed paperwork which will be entered into your electronic medical record.  These signatures attest to the fact that that the information above on your After Visit Summary has  been reviewed and is understood.  Full responsibility of the confidentiality of this discharge information lies with you and/or your care-partner.

## 2021-05-19 ENCOUNTER — Telehealth: Payer: Self-pay | Admitting: *Deleted

## 2021-05-19 NOTE — Telephone Encounter (Signed)
°  Follow up Call-  Call back number 05/17/2021  Post procedure Call Back phone  # [phone]  Permission to leave phone message Yes  Some recent data might be hidden     Patient questions:  Do you have a fever, pain , or abdominal swelling? No. Pain Score  0 *  Have you tolerated food without any problems? Yes.    Have you been able to return to your normal activities? Yes.    Do you have any questions about your discharge instructions: Diet   No. Medications  No. Follow up visit  No.  Do you have questions or concerns about your Care? No.  Actions: * If pain score is 4 or above: No action needed, pain <4.  Have you developed a fever since your procedure? no  2.   Have you had an respiratory symptoms (SOB or cough) since your procedure? no  3.   Have you tested positive for COVID 19 since your procedure no  4.   Have you had any family members/close contacts diagnosed with the COVID 19 since your procedure?  no   If yes to any of these questions please route to Joylene John, RN and Joella Prince, RN

## 2021-05-21 ENCOUNTER — Encounter: Payer: Self-pay | Admitting: Internal Medicine

## 2021-11-29 DIAGNOSIS — M79672 Pain in left foot: Secondary | ICD-10-CM | POA: Diagnosis not present

## 2021-12-13 ENCOUNTER — Other Ambulatory Visit: Payer: Self-pay | Admitting: Obstetrics & Gynecology

## 2021-12-13 DIAGNOSIS — Z1231 Encounter for screening mammogram for malignant neoplasm of breast: Secondary | ICD-10-CM

## 2021-12-14 ENCOUNTER — Ambulatory Visit
Admission: RE | Admit: 2021-12-14 | Discharge: 2021-12-14 | Disposition: A | Discharge status: Home / Self Care | Payer: BC Managed Care – PPO | Source: Ambulatory Visit | Attending: Obstetrics & Gynecology | Admitting: Obstetrics & Gynecology

## 2021-12-14 DIAGNOSIS — Z1231 Encounter for screening mammogram for malignant neoplasm of breast: Secondary | ICD-10-CM

## 2021-12-30 ENCOUNTER — Ambulatory Visit (INDEPENDENT_AMBULATORY_CARE_PROVIDER_SITE_OTHER): Payer: BC Managed Care – PPO | Admitting: Obstetrics & Gynecology

## 2021-12-30 ENCOUNTER — Other Ambulatory Visit (HOSPITAL_COMMUNITY)
Admission: RE | Admit: 2021-12-30 | Discharge: 2021-12-30 | Disposition: A | Discharge status: Home / Self Care | Payer: BC Managed Care – PPO | Source: Ambulatory Visit | Attending: Obstetrics & Gynecology | Admitting: Obstetrics & Gynecology

## 2021-12-30 ENCOUNTER — Encounter: Payer: Self-pay | Admitting: Obstetrics & Gynecology

## 2021-12-30 VITALS — BP 124/78 | HR 73 | Ht 64.75 in | Wt 176.0 lb

## 2021-12-30 DIAGNOSIS — Z01419 Encounter for gynecological examination (general) (routine) without abnormal findings: Secondary | ICD-10-CM | POA: Diagnosis not present

## 2021-12-30 DIAGNOSIS — N904 Leukoplakia of vulva: Secondary | ICD-10-CM

## 2021-12-30 DIAGNOSIS — Z780 Asymptomatic menopausal state: Secondary | ICD-10-CM | POA: Diagnosis not present

## 2021-12-30 NOTE — Progress Notes (Signed)
Brandy Smith [birth year] [id number]   History:    61 y.o. G3P3L3 Married.   RP:  Established patient presenting for annual gyn exam    HPI: Postmenopause, well on no HRT.  No PMB.  No pelvic pain.  Vulva much better, mild intermittent discomfort, rarely using Clobetasol. Using a lubricant with intercourse with no pain, recommend coconut oil.  Pap Neg in 06/2020.  Prefers to repeat Pap annually, Pap reflex done.  Minimal stress urinary incontinence unchanged since last year.  Bowel movements normal.  Breasts normal.  Mammo Neg 11/2021.  DM type 2 under treatment.  BMI 29.51.  B.D. Normal in 12/2017, will repeat at 5 years. Health labs with family physician.  Colonoscopy in 04/2021.    Past medical history,surgical history, family history and social history were all reviewed and documented in the EPIC chart.  Gynecologic History Patient's last menstrual period was 06/30/2008.  Obstetric History OB History  Gravida Para Term Preterm AB Living  '3 3 1 2 '$ 0 3  SAB IAB Ectopic Multiple Live Births  0   0        # Outcome Date GA Lbr Len/2nd Weight Sex Delivery Anes PTL Lv  3 Term           2 Preterm           1 Preterm              ROS: A ROS was performed and pertinent positives and negatives are included in the history.  GENERAL: No fevers or chills. HEENT: No change in vision, no earache, sore throat or sinus congestion. NECK: No pain or stiffness. CARDIOVASCULAR: No chest pain or pressure. No palpitations. PULMONARY: No shortness of breath, cough or wheeze. GASTROINTESTINAL: No abdominal pain, nausea, vomiting or diarrhea, melena or bright red blood per rectum. GENITOURINARY: No urinary frequency, urgency, hesitancy or dysuria. MUSCULOSKELETAL: No joint or muscle pain, no back pain, no recent trauma. DERMATOLOGIC: No rash, no itching, no lesions. ENDOCRINE: No polyuria, polydipsia, no heat or cold intolerance. No recent change in weight. HEMATOLOGICAL: No anemia or easy bruising or  bleeding. NEUROLOGIC: No headache, seizures, numbness, tingling or weakness. PSYCHIATRIC: No depression, no loss of interest in normal activity or change in sleep pattern.     Exam:   BP 124/78   Pulse 73   Ht 5' 4.75" (1.645 m)   Wt 176 lb (79.8 kg)   LMP 06/30/2008   SpO2 98%   BMI 29.51 kg/m   Body mass index is 29.51 kg/m.  General appearance : Well developed well nourished female. No acute distress HEENT: Eyes: no retinal hemorrhage or exudates,  Neck supple, trachea midline, no carotid bruits, no thyroidmegaly Lungs: Clear to auscultation, no rhonchi or wheezes, or rib retractions  Heart: Regular rate and rhythm, no murmurs or gallops Breast:Examined in sitting and supine position were symmetrical in appearance, no palpable masses or tenderness,  no skin retraction, no nipple inversion, no nipple discharge, no skin discoloration, no axillary or supraclavicular lymphadenopathy Abdomen: no palpable masses or tenderness, no rebound or guarding Extremities: no edema or skin discoloration or tenderness  Pelvic: Vulva: Normal             Vagina: No gross lesions or discharge  Cervix: No gross lesions or discharge.  Pap reflex done.  Uterus  AV, normal size, shape and consistency, non-tender and mobile  Adnexa  Without masses or tenderness  Anus: Normal   Assessment/Plan:  61 y.o. female  for annual exam   1. Encounter for routine gynecological examination with Papanicolaou smear of cervix Postmenopause, well on no HRT.  No PMB.  No pelvic pain.  Vulva much better, mild intermittent discomfort, rarely using Clobetasol. Using a lubricant with intercourse with no pain, recommend coconut oil.  Pap Neg in 06/2020.  Prefers to repeat Pap annually, Pap reflex done.  Minimal stress urinary incontinence unchanged since last year.  Bowel movements normal.  Breasts normal. Mammo Neg 11/2021.  DM type 2 under treatment.  BMI 29.51.  B.D. Normal in 12/2017, will repeat at 5 years. Health labs  with family physician.  Colonoscopy in 04/2021. - Cytology - PAP( [facility])  2. Postmenopause Postmenopause, well on no HRT.  No PMB.  No pelvic pain.  B.D. Normal in 12/2017, will repeat at 5 years.  3. Lichen sclerosus et atrophicus of the vulva Vulva much better, mild intermittent discomfort, rarely using Clobetasol.  Other orders - glipiZIDE (GLUCOTROL XL) 10 MG 24 hr tablet; Take 20 mg by mouth every morning.  Princess Bruins MD, 2:25 PM 12/30/2021

## 2022-01-03 LAB — CYTOLOGY - PAP: Diagnosis: NEGATIVE

## 2022-02-01 DIAGNOSIS — L7211 Pilar cyst: Secondary | ICD-10-CM | POA: Diagnosis not present

## 2022-02-01 DIAGNOSIS — L821 Other seborrheic keratosis: Secondary | ICD-10-CM | POA: Diagnosis not present

## 2022-02-01 DIAGNOSIS — D225 Melanocytic nevi of trunk: Secondary | ICD-10-CM | POA: Diagnosis not present

## 2022-02-01 DIAGNOSIS — L570 Actinic keratosis: Secondary | ICD-10-CM | POA: Diagnosis not present

## 2022-02-01 DIAGNOSIS — X32XXXA Exposure to sunlight, initial encounter: Secondary | ICD-10-CM | POA: Diagnosis not present

## 2022-04-07 ENCOUNTER — Other Ambulatory Visit (HOSPITAL_BASED_OUTPATIENT_CLINIC_OR_DEPARTMENT_OTHER): Payer: Self-pay | Admitting: Registered Nurse

## 2022-04-07 DIAGNOSIS — E7800 Pure hypercholesterolemia, unspecified: Secondary | ICD-10-CM

## 2022-04-19 ENCOUNTER — Ambulatory Visit (HOSPITAL_COMMUNITY): Payer: BC Managed Care – PPO

## 2022-04-26 ENCOUNTER — Ambulatory Visit (HOSPITAL_COMMUNITY)
Admission: RE | Admit: 2022-04-26 | Discharge: 2022-04-26 | Disposition: A | Discharge status: Home / Self Care | Payer: Self-pay | Source: Ambulatory Visit | Attending: Registered Nurse | Admitting: Registered Nurse

## 2022-04-26 DIAGNOSIS — E7800 Pure hypercholesterolemia, unspecified: Secondary | ICD-10-CM | POA: Insufficient documentation

## 2022-07-05 DIAGNOSIS — H04123 Dry eye syndrome of bilateral lacrimal glands: Secondary | ICD-10-CM | POA: Diagnosis not present

## 2022-08-09 DIAGNOSIS — H2513 Age-related nuclear cataract, bilateral: Secondary | ICD-10-CM | POA: Diagnosis not present

## 2022-08-09 DIAGNOSIS — E119 Type 2 diabetes mellitus without complications: Secondary | ICD-10-CM | POA: Diagnosis not present

## 2022-08-09 DIAGNOSIS — H40013 Open angle with borderline findings, low risk, bilateral: Secondary | ICD-10-CM | POA: Diagnosis not present

## 2022-08-09 DIAGNOSIS — H35363 Drusen (degenerative) of macula, bilateral: Secondary | ICD-10-CM | POA: Diagnosis not present

## 2022-08-29 DIAGNOSIS — H40013 Open angle with borderline findings, low risk, bilateral: Secondary | ICD-10-CM | POA: Diagnosis not present

## 2022-08-29 DIAGNOSIS — H40051 Ocular hypertension, right eye: Secondary | ICD-10-CM | POA: Diagnosis not present

## 2022-08-29 DIAGNOSIS — H40011 Open angle with borderline findings, low risk, right eye: Secondary | ICD-10-CM | POA: Diagnosis not present

## 2022-08-29 DIAGNOSIS — H40053 Ocular hypertension, bilateral: Secondary | ICD-10-CM | POA: Diagnosis not present

## 2022-09-12 DIAGNOSIS — H40012 Open angle with borderline findings, low risk, left eye: Secondary | ICD-10-CM | POA: Diagnosis not present

## 2022-09-12 DIAGNOSIS — H40052 Ocular hypertension, left eye: Secondary | ICD-10-CM | POA: Diagnosis not present

## 2022-12-15 ENCOUNTER — Other Ambulatory Visit: Payer: Self-pay | Admitting: Registered Nurse

## 2022-12-15 DIAGNOSIS — Z1231 Encounter for screening mammogram for malignant neoplasm of breast: Secondary | ICD-10-CM

## 2022-12-20 ENCOUNTER — Ambulatory Visit
Admission: RE | Admit: 2022-12-20 | Discharge: 2022-12-20 | Disposition: A | Discharge status: Home / Self Care | Payer: BC Managed Care – PPO | Source: Ambulatory Visit | Attending: Registered Nurse | Admitting: Registered Nurse

## 2022-12-20 DIAGNOSIS — Z1231 Encounter for screening mammogram for malignant neoplasm of breast: Secondary | ICD-10-CM

## 2023-01-10 DIAGNOSIS — I10 Essential (primary) hypertension: Secondary | ICD-10-CM | POA: Diagnosis not present

## 2023-01-10 DIAGNOSIS — E7800 Pure hypercholesterolemia, unspecified: Secondary | ICD-10-CM | POA: Diagnosis not present

## 2023-01-10 DIAGNOSIS — E1165 Type 2 diabetes mellitus with hyperglycemia: Secondary | ICD-10-CM | POA: Diagnosis not present

## 2023-01-10 DIAGNOSIS — E559 Vitamin D deficiency, unspecified: Secondary | ICD-10-CM | POA: Diagnosis not present

## 2023-01-19 DIAGNOSIS — H40013 Open angle with borderline findings, low risk, bilateral: Secondary | ICD-10-CM | POA: Diagnosis not present

## 2023-01-19 DIAGNOSIS — H40053 Ocular hypertension, bilateral: Secondary | ICD-10-CM | POA: Diagnosis not present

## 2023-03-10 ENCOUNTER — Ambulatory Visit: Payer: BC Managed Care – PPO | Admitting: Obstetrics and Gynecology

## 2023-03-20 ENCOUNTER — Ambulatory Visit: Payer: BC Managed Care – PPO | Admitting: Obstetrics and Gynecology

## 2023-04-07 ENCOUNTER — Encounter: Payer: Self-pay | Admitting: Radiology

## 2023-04-07 ENCOUNTER — Ambulatory Visit (INDEPENDENT_AMBULATORY_CARE_PROVIDER_SITE_OTHER): Payer: BC Managed Care – PPO | Admitting: Radiology

## 2023-04-07 ENCOUNTER — Ambulatory Visit: Payer: BC Managed Care – PPO | Admitting: Obstetrics and Gynecology

## 2023-04-07 VITALS — BP 134/86 | HR 78 | Ht 65.5 in | Wt 178.0 lb

## 2023-04-07 DIAGNOSIS — E2839 Other primary ovarian failure: Secondary | ICD-10-CM

## 2023-04-07 DIAGNOSIS — N958 Other specified menopausal and perimenopausal disorders: Secondary | ICD-10-CM | POA: Diagnosis not present

## 2023-04-07 DIAGNOSIS — Z01419 Encounter for gynecological examination (general) (routine) without abnormal findings: Secondary | ICD-10-CM | POA: Diagnosis not present

## 2023-04-07 MED ORDER — ESTRADIOL 0.1 MG/GM VA CREA: 1.0000 g | TOPICAL_CREAM | VAGINAL | 12 refills | Status: AC

## 2023-04-07 NOTE — Progress Notes (Signed)
   Brandy Smith [birth year] [id number]   History: Postmenopausal 62 y.o. presents for annual exam. C/o vaginal dryness and pain with intercourse. No other gyn concerns.   Gynecologic History Postmenopausal Last Pap: 2023. Results were: normal Last mammogram: 7/24. Results were: normal Last colonoscopy: 2022 DEXA:2019   Obstetric History OB History  Gravida Para Term Preterm AB Living  3 3 1 2  0 3  SAB IAB Ectopic Multiple Live Births  0   0        # Outcome Date GA Lbr Len/2nd Weight Sex Type Anes PTL Lv  3 Term           2 Preterm           1 Preterm              The following portions of the patient's history were reviewed and updated as appropriate: allergies, current medications, past family history, past medical history, past social history, past surgical history, and problem list.  Review of Systems Pertinent items noted in HPI and remainder of comprehensive ROS otherwise negative.  Past medical history, past surgical history, family history and social history were all reviewed and documented in the EPIC chart.  Exam:  Vitals:   04/07/23 0805  BP: 134/86  Pulse: 78  SpO2: 98%  Weight: 178 lb (80.7 kg)  Height: 5' 5.5" (1.664 m)   Body mass index is 29.17 kg/m.  General appearance:  Normal Thyroid:  Symmetrical, normal in size, without palpable masses or nodularity. Respiratory  Auscultation:  Clear without wheezing or rhonchi Cardiovascular  Auscultation:  Regular rate, without rubs, murmurs or gallops  Edema/varicosities:  Not grossly evident Abdominal  Soft,nontender, without masses, guarding or rebound.  Liver/spleen:  No organomegaly noted  Hernia:  None appreciated  Skin  Inspection:  Grossly normal Breasts: Examined lying and sitting.   Right: Without masses, retractions, nipple discharge or axillary adenopathy.   Left: Without masses, retractions, nipple discharge or axillary adenopathy. Genitourinary   Inguinal/mons:  Normal without  inguinal adenopathy  External genitalia:  Normal appearing vulva with no masses, tenderness, or lesions  BUS/Urethra/Skene's glands:  Normal  Vagina:  Normal appearing with normal color and discharge, no lesions. Atrophy: moderate   Cervix:  Normal appearing without discharge or lesions  Uterus:  Normal in size, shape and contour.  Midline and mobile, nontender  Adnexa/parametria:     Rt: Normal in size, without masses or tenderness.   Lt: Normal in size, without masses or tenderness.  Anus and perineum: Normal    Raynelle Fanning, CMA present for exam  Assessment/Plan:   1. Well woman exam with routine gynecological exam Pap 2026  2. Genitourinary syndrome of menopause - estradiol (ESTRACE VAGINAL) 0.1 MG/GM vaginal cream; Place 1 g vaginally 3 (three) times a week.  Dispense: 42.5 g; Refill: 12  3. Estrogen deficiency - DG Bone Density; Future   Discussed SBE, colonoscopy and DEXA screening as directed. Recommend of exercise weekly, including weight bearing exercise.  Return in 1 year for annual or sooner prn.  Tanda Rockers WHNP-BC, 9:47 AM 04/07/2023

## 2023-07-25 DIAGNOSIS — H524 Presbyopia: Secondary | ICD-10-CM | POA: Diagnosis not present

## 2023-07-25 DIAGNOSIS — H40013 Open angle with borderline findings, low risk, bilateral: Secondary | ICD-10-CM | POA: Diagnosis not present

## 2023-07-25 DIAGNOSIS — H25813 Combined forms of age-related cataract, bilateral: Secondary | ICD-10-CM | POA: Diagnosis not present

## 2023-07-25 DIAGNOSIS — H40053 Ocular hypertension, bilateral: Secondary | ICD-10-CM | POA: Diagnosis not present

## 2023-07-25 DIAGNOSIS — E119 Type 2 diabetes mellitus without complications: Secondary | ICD-10-CM | POA: Diagnosis not present

## 2023-11-23 ENCOUNTER — Other Ambulatory Visit: Payer: BC Managed Care – PPO

## 2023-11-23 DIAGNOSIS — H40053 Ocular hypertension, bilateral: Secondary | ICD-10-CM | POA: Diagnosis not present

## 2023-11-23 DIAGNOSIS — H40013 Open angle with borderline findings, low risk, bilateral: Secondary | ICD-10-CM | POA: Diagnosis not present

## 2023-11-28 DIAGNOSIS — S99822A Other specified injuries of left foot, initial encounter: Secondary | ICD-10-CM | POA: Diagnosis not present

## 2023-11-28 DIAGNOSIS — L7211 Pilar cyst: Secondary | ICD-10-CM | POA: Diagnosis not present

## 2023-11-28 DIAGNOSIS — L820 Inflamed seborrheic keratosis: Secondary | ICD-10-CM | POA: Diagnosis not present

## 2023-12-05 ENCOUNTER — Other Ambulatory Visit (HOSPITAL_BASED_OUTPATIENT_CLINIC_OR_DEPARTMENT_OTHER)

## 2024-04-11 DIAGNOSIS — R051 Acute cough: Secondary | ICD-10-CM | POA: Diagnosis not present

## 2024-04-11 DIAGNOSIS — J329 Chronic sinusitis, unspecified: Secondary | ICD-10-CM | POA: Diagnosis not present

## 2024-04-11 DIAGNOSIS — B9689 Other specified bacterial agents as the cause of diseases classified elsewhere: Secondary | ICD-10-CM | POA: Diagnosis not present

## 2024-04-11 DIAGNOSIS — B3731 Acute candidiasis of vulva and vagina: Secondary | ICD-10-CM | POA: Diagnosis not present

## 2024-04-11 DIAGNOSIS — R509 Fever, unspecified: Secondary | ICD-10-CM | POA: Diagnosis not present
